# Patient Record
Sex: Female | Born: 2000 | Race: Black or African American | Hispanic: No | Marital: Single | Smoking: Current every day smoker
Health system: Southern US, Community
[De-identification: ages and names within clinical notes are randomized; demographics above are authoritative.]

## PROBLEM LIST (undated history)

## (undated) ENCOUNTER — Inpatient Hospital Stay (HOSPITAL_COMMUNITY): Payer: Self-pay

## (undated) ENCOUNTER — Emergency Department (HOSPITAL_COMMUNITY): Disposition: A | Discharge status: Home / Self Care

## (undated) DIAGNOSIS — Z789 Other specified health status: Secondary | ICD-10-CM

## (undated) DIAGNOSIS — B1920 Unspecified viral hepatitis C without hepatic coma: Secondary | ICD-10-CM

## (undated) DIAGNOSIS — A749 Chlamydial infection, unspecified: Secondary | ICD-10-CM

## (undated) DIAGNOSIS — I10 Essential (primary) hypertension: Secondary | ICD-10-CM

## (undated) HISTORY — DX: Other specified health status: Z78.9

## (undated) HISTORY — PX: NO PAST SURGERIES: SHX2092

---
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
## 2019-06-13 IMAGING — US US MFM OB COMP +14 WKS
1 series · 14 of 28 positions shown · non-contrast
Comparison: none

[Series 1: us mfm ob comp +14 wks · 14 of 28 slices shown]
[im 2/28]
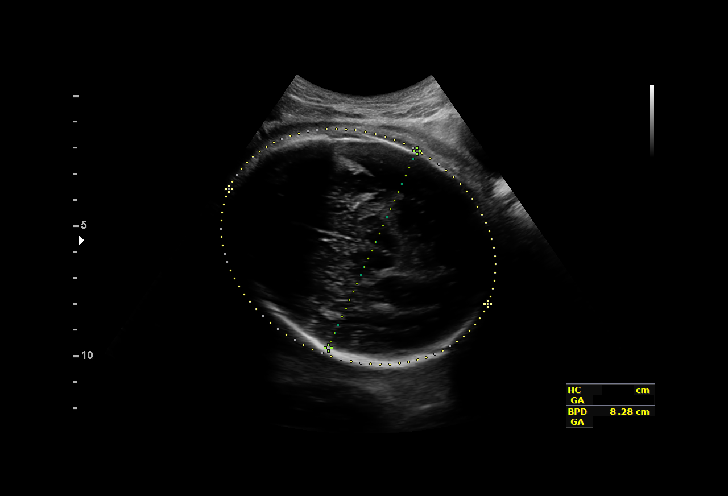
[im 4/28]
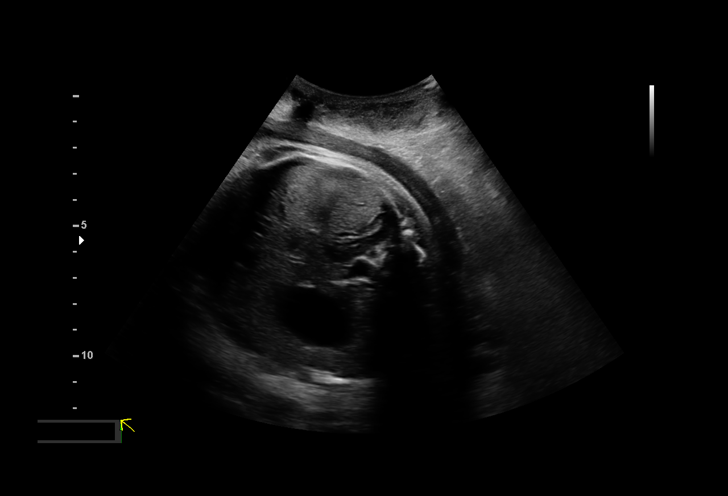
[im 6/28]
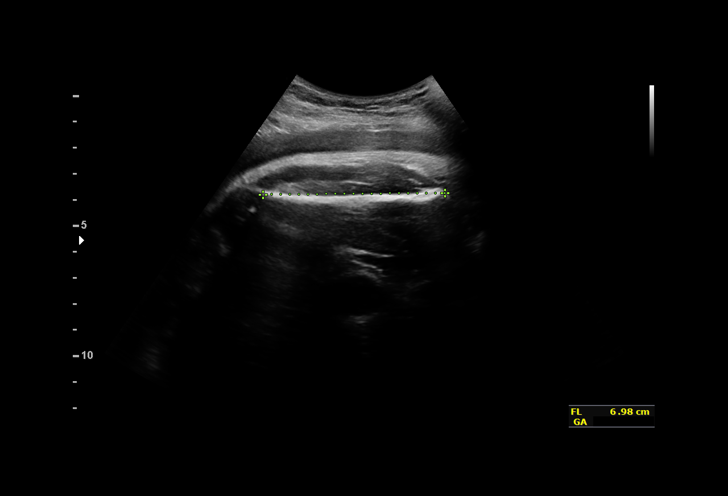
[im 8/28]
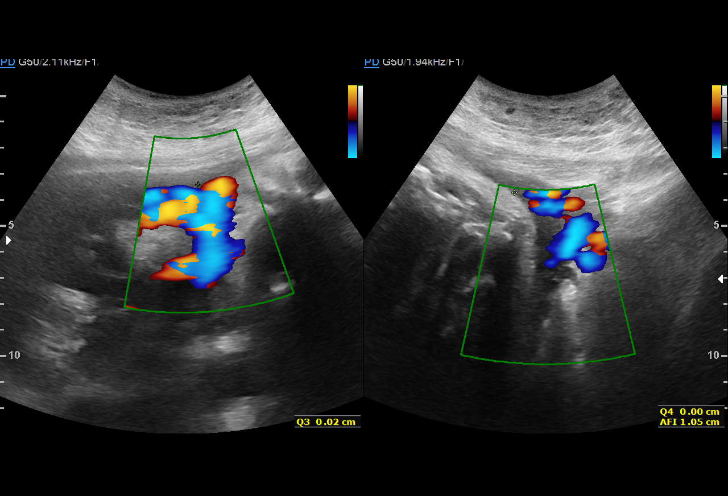
[im 10/28]
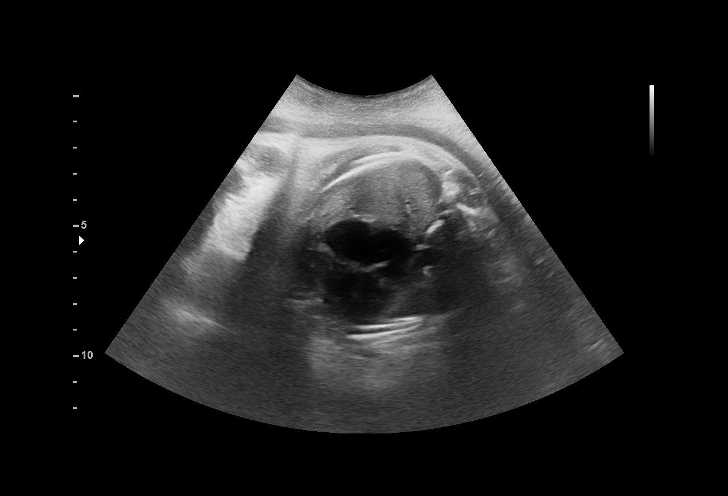
[im 12/28]
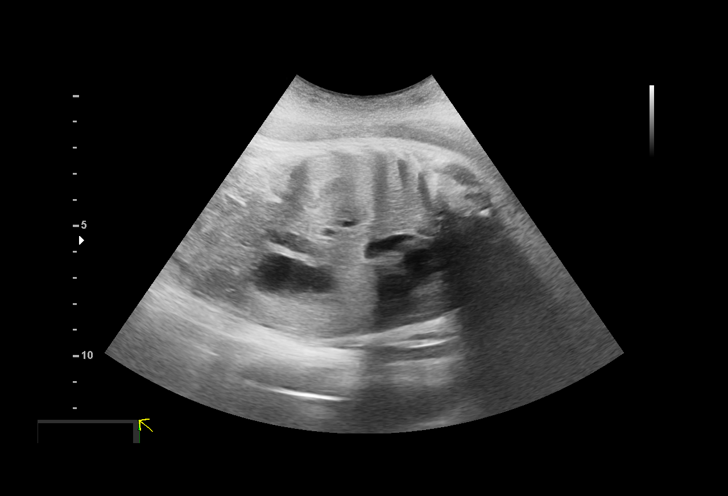
[im 14/28]
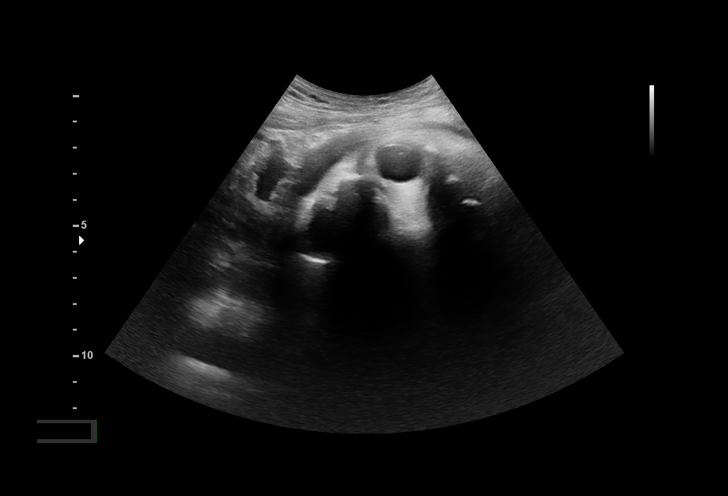
[im 16/28]
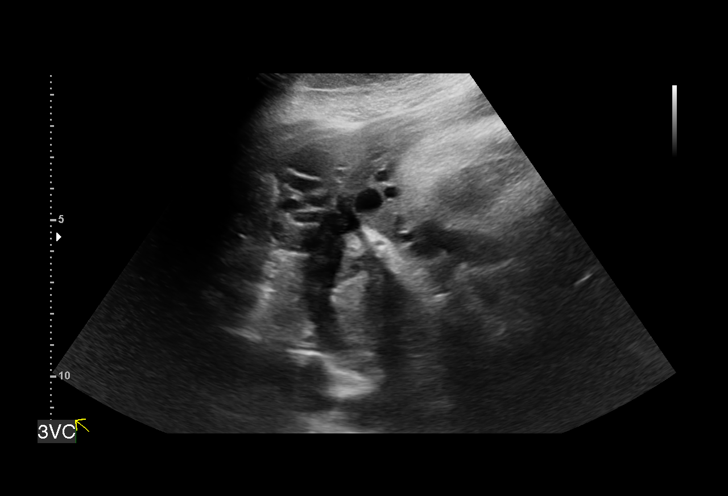
[im 18/28]
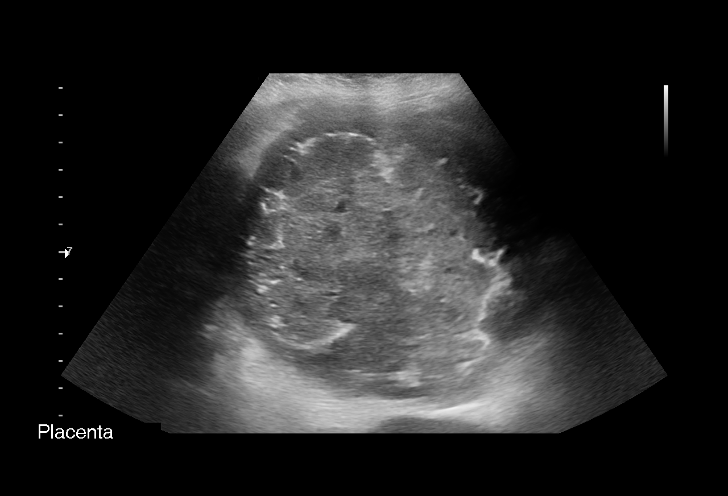
[im 20/28]
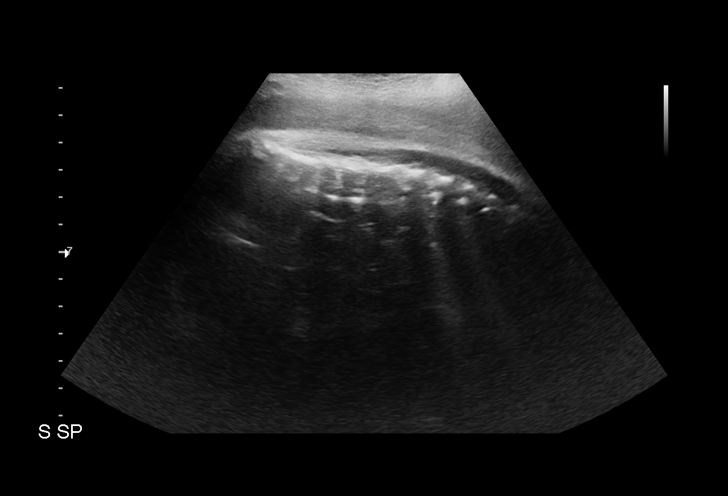
[im 22/28]
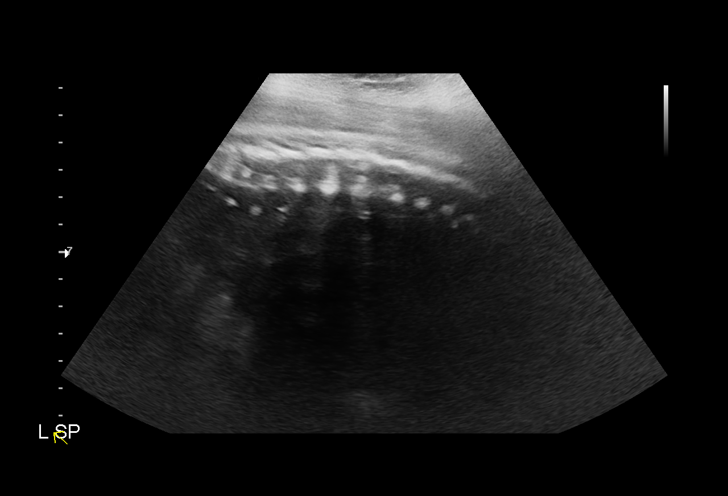
[im 24/28]
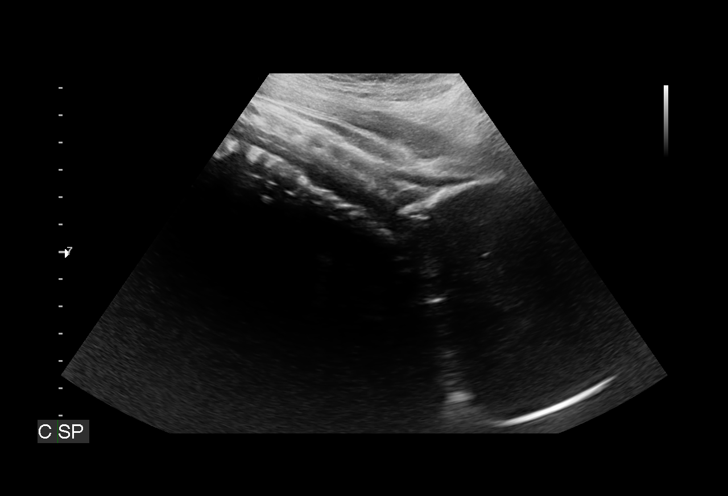
[im 26/28]
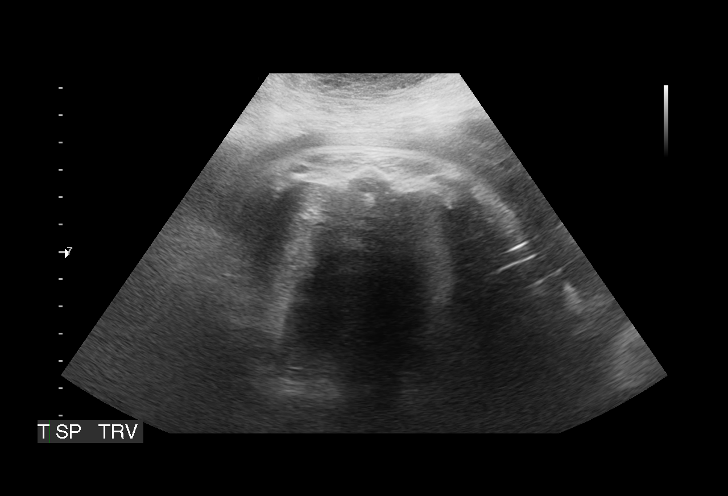
[im 28/28]
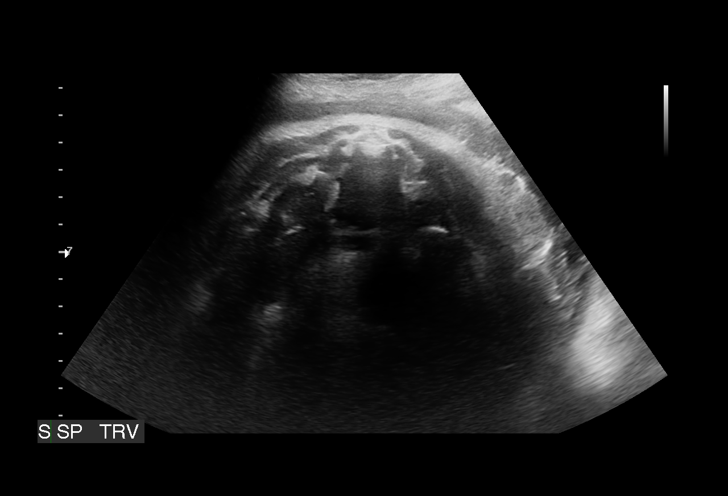

[14 of 28 positions shown; findings below may reference images not displayed]

Name:       AQASALIM LAY-LAY               Visit Date: 11/08/2017 [DATE]

1  BRANIMIR DIZDAR           [id number]      [id number]     [id number]
Indications

34 weeks gestation of pregnancy
Premature rupture of membranes - leaking
fluid
Elevated blood pressure affecting pregnancy
in third trimester
Encounter for uncertain dates
OB History

Gravidity:    1
Fetal Evaluation

Num Of Fetuses:     1
Fetal Heart         122
Rate(bpm):
Cardiac Activity:   Observed
Presentation:       Cephalic
Placenta:           Posterior

Amniotic Fluid
AFI FV:      Oligohydramnios

AFI Sum(cm)     %Tile       Largest Pocket(cm)
1.05            < 3

RUQ(cm)       RLQ(cm)       LUQ(cm)        LLQ(cm)
1.03          0             0
Biometry

BPD:      82.8  mm     G. Age:  33w 2d         21  %    CI:        72.99   %    70 - 86
FL/HC:      22.7   %    19.4 -
HC:      308.1  mm     G. Age:  34w 3d         19  %    HC/AC:      1.04        0.96 -
AC:      295.4  mm     G. Age:  33w 4d         33  %    FL/BPD:     84.3   %    71 - 87
FL:       69.8  mm     G. Age:  35w 6d         79  %    FL/AC:      23.6   %    20 - 24

Est. FW:    6999  gm      5 lb 4 oz     59  %
Gestational Age

U/S Today:     34w 2d                                        EDD:   12/18/17
Best:          34w 2d     Det. By:  U/S (11/08/17)           EDD:   12/18/17
Anatomy

Cranium:               Appears normal         LVOT:                   Not well visualized
Cavum:                 Not well visualized    Aortic Arch:            Not well visualized
Ventricles:            Not well visualized    Ductal Arch:            Not well visualized
Choroid Plexus:        Not well visualized    Diaphragm:              Appears normal
Cerebellum:            Not well visualized    Stomach:                Appears normal, left
sided
Posterior Fossa:       Not well visualized    Abdomen:                Appears normal
Nuchal Fold:           Not well visualized    Abdominal Wall:         Not well visualized
Face:                  Profile appears        Cord Vessels:           Appears normal (3
normal                                         vessel cord)
Lips:                  Not well visualized    Kidneys:                Appear normal
Palate:                Not well visualized    Bladder:                Not well visualized
Thoracic:              Appears normal         Spine:                  Appears normal
Heart:                 Appears normal         Upper Extremities:      Not well visualized
(4CH, axis, and situs
RVOT:                  Not well visualized    Lower Extremities:      Not well visualized
Impression

Single living intrauterine pregnancy at 34w 2d (remote read of
images only)
Cephalic presentation.
Placenta Posterior.
oligohydramnios
no fetal anomalies are demonstrated
limited views as documented above
Appropriate interval fetal growth.
Normal interval fetal anatomy.
Recommendations

Management as clinically indicated by on site OB provider.

## 2019-10-27 ENCOUNTER — Ambulatory Visit: Payer: No Typology Code available for payment source | Admitting: Nurse Practitioner

## 2020-02-08 ENCOUNTER — Other Ambulatory Visit (HOSPITAL_COMMUNITY)
Admission: RE | Admit: 2020-02-08 | Discharge: 2020-02-08 | Disposition: A | Discharge status: Home / Self Care | Payer: Medicaid Other | Source: Ambulatory Visit | Attending: Nurse Practitioner | Admitting: Nurse Practitioner

## 2020-02-08 ENCOUNTER — Other Ambulatory Visit: Payer: Self-pay

## 2020-02-08 ENCOUNTER — Ambulatory Visit (INDEPENDENT_AMBULATORY_CARE_PROVIDER_SITE_OTHER): Payer: Medicaid Other | Admitting: Nurse Practitioner

## 2020-02-08 ENCOUNTER — Encounter: Payer: Self-pay | Admitting: Nurse Practitioner

## 2020-02-08 VITALS — BP 131/88 | HR 85 | Ht 61.0 in | Wt 177.4 lb

## 2020-02-08 DIAGNOSIS — Z01419 Encounter for gynecological examination (general) (routine) without abnormal findings: Secondary | ICD-10-CM

## 2020-02-08 DIAGNOSIS — Z6833 Body mass index (BMI) 33.0-33.9, adult: Secondary | ICD-10-CM

## 2020-02-08 DIAGNOSIS — Z975 Presence of (intrauterine) contraceptive device: Secondary | ICD-10-CM | POA: Insufficient documentation

## 2020-02-08 DIAGNOSIS — Z8759 Personal history of other complications of pregnancy, childbirth and the puerperium: Secondary | ICD-10-CM

## 2020-02-08 NOTE — Progress Notes (Signed)
GYNECOLOGY ANNUAL PREVENTATIVE CARE ENCOUNTER NOTE  Subjective:   Sarah Luna is a 19 y.o. G1P1 female here for a routine annual gynecologic exam.  Current complaints: None.   Denies abnormal vaginal bleeding, discharge, pelvic pain, problems with intercourse or other gynecologic concerns.    Gynecologic History Patient's last menstrual period was 01/14/2020 (exact date). Contraception: Nexplanon   Obstetric History OB History  Gravida Para Term Preterm AB Living  1 1       1   SAB TAB Ectopic Multiple Live Births        0 1    # Outcome Date GA Lbr Len/2nd Weight Sex Delivery Anes PTL Lv  1 Para 11/08/17  09:40 / 00:07 6 lb 5.9 oz (2.89 kg) F Vag-Spont EPI  LIV    History reviewed. No pertinent past medical history.  History reviewed. No pertinent surgical history.  Current Outpatient Medications on File Prior to Visit  Medication Sig Dispense Refill   Etonogestrel (NEXPLANON [place]) Inject into the skin.     acetaminophen (TYLENOL) 325 MG tablet Take 2 tablets (650 mg total) by mouth every 4 (four) hours as needed (for pain scale < 4). (Patient not taking: Reported on 12/08/2017) 30 tablet 5   amLODipine (NORVASC) 5 MG tablet Take 1 tablet (5 mg total) by mouth daily. (Patient not taking: Reported on 02/08/2020) 30 tablet 1   fosinopril-hydrochlorothiazide (MONOPRIL-HCT) 20-12.5 MG tablet Take 1 tablet by mouth daily. (Patient not taking: Reported on 12/08/2017) 30 tablet 1   ibuprofen (ADVIL,MOTRIN) 600 MG tablet Take 1 tablet (600 mg total) by mouth every 6 (six) hours. (Patient not taking: Reported on 12/08/2017) 30 tablet 0   No current facility-administered medications on file prior to visit.    No Known Allergies  Social History   Socioeconomic History   Marital status: Single    Spouse name: Not on file   Number of children: Not on file   Years of education: Not on file   Highest education level: Not on file  Occupational History   Not on file    Tobacco Use   Smoking status: Never Smoker   Smokeless tobacco: Never Used  Substance and Sexual Activity   Alcohol use: Never   Drug use: Never   Sexual activity: Not Currently    Birth control/protection: Pill  Other Topics Concern   Not on file  Social History Narrative   Not on file   Social Determinants of Health   Financial Resource Strain:    Difficulty of Paying Living Expenses:   Food Insecurity:    Worried About 12/10/2017 in the Last Year:    Programme researcher, broadcasting/film/video in the Last Year:   Transportation Needs:    Barista (Medical):    Lack of Transportation (Non-Medical):   Physical Activity:    Days of Exercise per Week:    Minutes of Exercise per Session:   Stress:    Feeling of Stress :   Social Connections:    Frequency of Communication with Friends and Family:    Frequency of Social Gatherings with Friends and Family:    Attends Religious Services:    Active Member of Clubs or Organizations:    Attends Freight forwarder:    Marital Status:   Intimate Partner Violence:    Fear of Current or Ex-Partner:    Emotionally Abused:    Physically Abused:    Sexually Abused:     Family  History  Problem Relation Age of Onset   Diabetes Maternal Grandmother    Hypertension Maternal Grandmother     The following portions of the patient's history were reviewed and updated as appropriate: allergies, current medications, past family history, past medical history, past social history, past surgical history and problem list.  Review of Systems Pertinent items noted in HPI and remainder of comprehensive ROS otherwise negative.   Objective:  BP (!) 141/96    Pulse 95    Ht 5\' 1"  (1.549 m)    Wt 177 lb 6.4 oz (80.5 kg)    LMP 01/14/2020 (Exact Date)    BMI 33.52 kg/m  CONSTITUTIONAL: Well-developed, well-nourished female in no acute distress.  HENT:  Normocephalic, atraumatic, External right and left ear normal.   EYES: Conjunctivae and EOM are normal. Pupils are equal, round.  No scleral icterus.  NECK: Normal range of motion, supple, no masses.  Normal thyroid.  SKIN: Skin is warm and dry. No rash noted. Not diaphoretic. No erythema. No pallor. NEUROLOGIC: Alert and oriented to person, place, and time. Normal reflexes, muscle tone coordination. No cranial nerve deficit noted. PSYCHIATRIC: Normal mood and affect. Normal behavior. Normal judgment and thought content. CARDIOVASCULAR: Normal heart rate noted, regular rhythm RESPIRATORY: Clear to auscultation bilaterally. Effort and breath sounds normal, no problems with respiration noted. BREASTS: Symmetric in size. No masses, skin changes, nipple drainage, or lymphadenopathy. ABDOMEN: Soft, no distention noted.  No tenderness, rebound or guarding.  PELVIC: Normal appearing external genitalia; normal appearing vaginal mucosa and cervix.  No abnormal discharge noted. Normal uterine size, no other palpable masses, no uterine or adnexal tenderness. MUSCULOSKELETAL: Normal range of motion. No tenderness.  No cyanosis, clubbing, or edema.    Assessment and Plan:  1. Women's annual routine gynecological examination Continue Nexplanon - rod intact in arm - until it expires. Plans to have this one removed and have another rod placed. Does not plan to have children at this time. Declines to have blood drawn today. Plans to have Covid vaccine soon.  2. BMI 33.0-33.9,adult Advised weight loss to BMI less than 25  3. History of pre-eclampsia Stopped medication that she was on.  Reports she was told to take for a month and then she would not need it. BP at end of her visit today was normal.  Routine preventative health maintenance measures emphasized. Please refer to After Visit Summary for other counseling recommendations.    12-01-1980, RN, MSN, NP-BC Nurse Practitioner, [facility]

## 2020-02-08 NOTE — Progress Notes (Signed)
Pt. Presents for STI testing Pt. Stated she use to be on BP medication but came off about a year ago.

## 2020-02-08 NOTE — Patient Instructions (Signed)
Drink at least 8 8-oz glasses of water every day. Eat a healthy diet.   Walk briskly to the point of a small amount of perspiration on your forehead for 20 min a day to help you lose weight.

## 2020-02-10 LAB — CERVICOVAGINAL ANCILLARY ONLY
Bacterial Vaginitis (gardnerella): POSITIVE — AB
Candida Glabrata: NEGATIVE
Candida Vaginitis: NEGATIVE
Chlamydia: POSITIVE — AB
Comment: NEGATIVE
Comment: NEGATIVE
Comment: NEGATIVE
Comment: NEGATIVE
Comment: NEGATIVE
Comment: NORMAL
Neisseria Gonorrhea: NEGATIVE
Trichomonas: POSITIVE — AB

## 2020-02-11 ENCOUNTER — Encounter: Payer: Self-pay | Admitting: Nurse Practitioner

## 2020-02-11 DIAGNOSIS — Z8619 Personal history of other infectious and parasitic diseases: Secondary | ICD-10-CM | POA: Insufficient documentation

## 2020-02-11 MED ORDER — METRONIDAZOLE 500 MG PO TABS: 500.0000 mg | ORAL_TABLET | Freq: Two times a day (BID) | ORAL | 0 refills | Status: DC

## 2020-02-11 MED ORDER — AZITHROMYCIN 250 MG PO TABS: 1000.0000 mg | ORAL_TABLET | Freq: Once | ORAL | 0 refills | Status: DC

## 2020-02-11 NOTE — Addendum Note (Signed)
Addended by: Currie Paris on: 02/11/2020 12:52 PM   Modules accepted: Orders

## 2020-02-11 NOTE — Progress Notes (Signed)
Chlamydia and trichomonas noted - Please notify the patient and the health department.  Will send medication to her pharmacy. But no MyChart is available to send a message to her.

## 2020-11-16 ENCOUNTER — Other Ambulatory Visit: Payer: Self-pay

## 2020-11-16 ENCOUNTER — Ambulatory Visit (INDEPENDENT_AMBULATORY_CARE_PROVIDER_SITE_OTHER): Payer: Medicaid Other

## 2020-11-16 VITALS — BP 144/95 | HR 94 | Ht 62.0 in | Wt 193.0 lb

## 2020-11-16 DIAGNOSIS — Z3046 Encounter for surveillance of implantable subdermal contraceptive: Secondary | ICD-10-CM | POA: Diagnosis not present

## 2020-11-16 DIAGNOSIS — Z975 Presence of (intrauterine) contraceptive device: Secondary | ICD-10-CM

## 2020-11-16 MED ORDER — ETONOGESTREL 68 MG ~~LOC~~ IMPL
68.0000 mg | DRUG_IMPLANT | Freq: Once | SUBCUTANEOUS | Status: AC
  Administered 2020-11-16: 68 mg via SUBCUTANEOUS

## 2020-11-16 NOTE — Progress Notes (Addendum)
GYN presents for Nexplanon removal and Insertion.  Administrations This Visit    etonogestrel (NEXPLANON) implant 68 mg    Admin Date 11/16/2020 Action Given Dose 68 mg Route Subdermal Administered By Maretta Bees, RMA

## 2020-11-16 NOTE — Progress Notes (Signed)
GYNECOLOGY OFFICE VISIT NOTE  History:  Sarah Luna is a 20 y.o. [phone] here today for Nexplanon removal and reinsertion. She denies any abnormal vaginal discharge, bleeding, or pelvic pain.  However, she expresses concern with weight gain of ~30lbs since insertion.  Patient states that she does not exercise and eats "everything."  Patient reports she has a cycle Q2-3 months and has no pain or discomfort during sex.  She is overall satisfied with her method.   No past medical history on file.  No past surgical history on file.  The following portions of the patient's history were reviewed and updated as appropriate: allergies, current medications, past family history, past medical history, past social history, past surgical history and problem list.   Health Maintenance:  No pap on file d/t age.  No mammogram on file d/t age.   Review of Systems:  General ROS: negative  Genito-Urinary ROS: negative  Objective:  Vitals: BP (!) 144/95   Pulse 94   Ht 5\' 2"  (1.575 m)   Wt 193 lb (87.5 kg)   BMI 35.30 kg/m   Physical Exam Constitutional:      General: She is not in acute distress.    Appearance: Normal appearance.  HENT:     Head: Normocephalic and atraumatic.  Eyes:     Conjunctiva/sclera: Conjunctivae normal.  Cardiovascular:     Rate and Rhythm: Normal rate.  Pulmonary:     Effort: Pulmonary effort is normal. No respiratory distress.  Musculoskeletal:        General: Normal range of motion.     Cervical back: Normal range of motion.  Skin:    General: Skin is warm and dry.  Neurological:     Mental Status: She is alert and oriented to person, place, and time.  Psychiatric:        Mood and Affect: Mood normal.        Behavior: Behavior normal.      Nexplanon Removal Procedure:  Yulissa A Winner requests removal of expired Nexplanon and reinsertion of new Nexplanon.  Patient also understands that removal can result in blood loss, infection at removal  site, and pain as well as bruising and parenthesis with subsequent insertion.  Patient verbalizes understanding for all risks as stated above and in the consent and desires to proceed with procedure.    Exp: December 19, 2022 Lot: December 21, 2022 NDC: [phone]  Appropriate time out taken.  Patient identified, informed consent performed, consent signed.  Nexplanon site identified; left arm.   The area was then prepped with betadine and allowed 60 seconds before being wiped away with sterile gauze. 1 ml of 1% lidocaine with epi was used to anesthetize the area at the distal end of the implant. A small stab incision was made right beside the implant on the distal portion.  The Nexplanon rod was visualized within the sheath. The sheath was gently removed with the scalpel, resulting in the Nexplanon ejecting from the site without difficulty. The anticipated pathway of insertion was then anesthetized with an additional 23mL of lidocaine. Nexplanon was removed from packaging and device was confirmed within needle by provider visualization.  The device was then inserted per manufacturers instruction without complications.  The device was then palpated in the patient's arm by patient and provider. The insertion area was hemostatic and covered with steri strip and band-aid.  A pressure dressing was then applied to reduce bruising.   *Remove of pressure dressing and band-aid in 12-24 hours. *Remove  of steri-strips after no more than 7 days. *Condom usage or abstinence for the next 7 days. *Tylenol or ibuprofen for insertion pain/discomfort. *Call for any other questions, concerns, or complications.   Assessment & Plan:  20 year old Nexplanon Removal and Reinsertion Weight Gain  -Reviewed concern with weight gain. -Discussed AHA recommendation of 30 minutes of moderate to vigorous exercise 5x/week.  -Encouraged to start gradually and increase as tolerated. -Reviewed healthy/balanced eating habits and  choices. -Discussed returning in 6 months for annual exam and referral to nutrition and weight loss services if no improvement with above interventions. -Nexplanon inserted as above. -Encouraged condom usage to reduce risk of STD exposure.  Total face-to-face time with patient: 15 minutes   Cherre Robins MSN, CNM 11/16/2020

## 2021-04-22 ENCOUNTER — Ambulatory Visit: Payer: Medicaid Other | Admitting: Internal Medicine

## 2021-06-04 ENCOUNTER — Ambulatory Visit: Payer: Self-pay | Attending: Internal Medicine | Admitting: Internal Medicine

## 2021-06-04 ENCOUNTER — Encounter: Payer: Self-pay | Admitting: Internal Medicine

## 2021-06-04 ENCOUNTER — Other Ambulatory Visit: Payer: Self-pay

## 2021-06-04 VITALS — BP 129/79 | HR 86 | Ht 62.0 in | Wt 183.0 lb

## 2021-06-04 DIAGNOSIS — I10 Essential (primary) hypertension: Secondary | ICD-10-CM | POA: Insufficient documentation

## 2021-06-04 DIAGNOSIS — Z114 Encounter for screening for human immunodeficiency virus [HIV]: Secondary | ICD-10-CM

## 2021-06-04 DIAGNOSIS — Z1159 Encounter for screening for other viral diseases: Secondary | ICD-10-CM

## 2021-06-04 DIAGNOSIS — E66811 Obesity, class 1: Secondary | ICD-10-CM

## 2021-06-04 DIAGNOSIS — Z7189 Other specified counseling: Secondary | ICD-10-CM | POA: Insufficient documentation

## 2021-06-04 DIAGNOSIS — Z2821 Immunization not carried out because of patient refusal: Secondary | ICD-10-CM

## 2021-06-04 DIAGNOSIS — Z7689 Persons encountering health services in other specified circumstances: Secondary | ICD-10-CM

## 2021-06-04 DIAGNOSIS — Z79899 Other long term (current) drug therapy: Secondary | ICD-10-CM | POA: Insufficient documentation

## 2021-06-04 DIAGNOSIS — Z713 Dietary counseling and surveillance: Secondary | ICD-10-CM | POA: Insufficient documentation

## 2021-06-04 DIAGNOSIS — Z8619 Personal history of other infectious and parasitic diseases: Secondary | ICD-10-CM | POA: Insufficient documentation

## 2021-06-04 DIAGNOSIS — Z6833 Body mass index (BMI) 33.0-33.9, adult: Secondary | ICD-10-CM | POA: Insufficient documentation

## 2021-06-04 DIAGNOSIS — F17290 Nicotine dependence, other tobacco product, uncomplicated: Secondary | ICD-10-CM

## 2021-06-04 DIAGNOSIS — E669 Obesity, unspecified: Secondary | ICD-10-CM | POA: Insufficient documentation

## 2021-06-04 DIAGNOSIS — R030 Elevated blood-pressure reading, without diagnosis of hypertension: Secondary | ICD-10-CM

## 2021-06-04 NOTE — Progress Notes (Signed)
Patient ID: Sarah Luna, female    DOB: 02/28/2001  MRN: [id number]  CC: New Patient (Initial Visit) and Hypertension   Subjective: Sarah Luna is a 20 y.o. female who presents for new pt visit Her concerns today include:  Pt with hx of HTN, obesity, pre-eclampsia,   Pt here to est care.  Use to be seen at [facility]. Pt with hx of pre-eclampsia in 2019.  BP remained elev post delivery so she was kept on antihypertensive for a while.Marland Kitchen She was able to get off med for a few mths but had to be put back on it. She was on Norvasc and Lis/HCTZ.  Ran out 01/2020.  Checks BP every few days.  Last reading 120/84 3 days ago. Occasional higher. No CP/SOB/LE edema/dizziness.  Occasional HA -admits that her salt intake is more than it should be  Obesity: reports wgh gain of about 30 lbs since placement of Nexplanon 2019.  Placed after giving birth.  She has cut back on eating late at nights and on portion sizes. Eating more fruits. Drinks water and juices  Hx of hep C on chart but pt not aware of ever having been diagnosed with this.  I also see no lab results in the chart of hepatitis C antibody or RNA quant.   She smokes cigars x1 year.  She is wanting to quit.  She has been weaning herself down.  Previously she was smoking 4 cigars a day but now down to 1 cigar a day.    Past medical, surgical, family history and social history reviewed and updated.  HM: Patient declines flu and pneumonia vaccines.  She is agreeable to screening for hepatitis C and HIV. Patient Active Problem List   Diagnosis Date Noted   History of hepatitis C 02/11/2020   Nexplanon in place 02/08/2020   Pre-eclampsia in third trimester 11/08/2017   Obesity (BMI 30.0-34.9) 02/23/2014     Current Outpatient Medications on File Prior to Visit  Medication Sig Dispense Refill   acetaminophen (TYLENOL) 325 MG tablet Take 2 tablets (650 mg total) by mouth every 4 (four) hours as needed (for pain scale < 4).  (Patient not taking: Reported on 12/08/2017) 30 tablet 5   amLODipine (NORVASC) 5 MG tablet Take 1 tablet (5 mg total) by mouth daily. (Patient not taking: Reported on 02/08/2020) 30 tablet 1   Etonogestrel (NEXPLANON [place]) Inject into the skin. (Patient not taking: Reported on 06/04/2021)     fosinopril-hydrochlorothiazide (MONOPRIL-HCT) 20-12.5 MG tablet Take 1 tablet by mouth daily. (Patient not taking: Reported on 12/08/2017) 30 tablet 1   ibuprofen (ADVIL,MOTRIN) 600 MG tablet Take 1 tablet (600 mg total) by mouth every 6 (six) hours. (Patient not taking: Reported on 12/08/2017) 30 tablet 0   metroNIDAZOLE (FLAGYL) 500 MG tablet Take 1 tablet (500 mg total) by mouth 2 (two) times daily. No alcohol while taking this medication.  Begin the next day after taking Azithromycin. (Patient not taking: Reported on 11/16/2020) 14 tablet 0   No current facility-administered medications on file prior to visit.    No Known Allergies  Social History   Socioeconomic History   Marital status: Single    Spouse name: Not on file   Number of children: 1   Years of education: Not on file   Highest education level: 12th grade  Occupational History   Occupation: [employer]  Tobacco Use   Smoking status: Every Day    Types: Cigars  Smokeless tobacco: Never  Vaping Use   Vaping Use: Never used  Substance and Sexual Activity   Alcohol use: Never   Drug use: Never   Sexual activity: Not Currently    Birth control/protection: Implant  Other Topics Concern   Not on file  Social History Narrative   Not on file   Social Determinants of Health   Financial Resource Strain: Not on file  Food Insecurity: Not on file  Transportation Needs: Not on file  Physical Activity: Not on file  Stress: Not on file  Social Connections: Not on file  Intimate Partner Violence: Not on file    Family History  Problem Relation Age of Onset   Diabetes Maternal Grandmother    Hypertension Maternal  Grandmother     Past Surgical History:  Procedure Laterality Date   NO PAST SURGERIES      ROS: Review of Systems Negative except as stated above  PHYSICAL EXAM: BP 129/79    Pulse 86    Ht 5\' 2"  (1.575 m)    Wt 183 lb (83 kg)    SpO2 100%    BMI 33.47 kg/m   Wt Readings from Last 3 Encounters:  06/04/21 183 lb (83 kg)  11/16/20 193 lb (87.5 kg)  02/08/20 177 lb 6.4 oz (80.5 kg)    Physical Exam  General appearance - alert, well appearing, young obese African-American female and in no distress Mental status - normal mood, behavior, speech, dress, motor activity, and thought processes Nose - normal and patent, no erythema, discharge or polyps Mouth - mucous membranes moist, pharynx normal without lesions Neck - supple, no significant adenopathy Chest - clear to auscultation, no wheezes, rales or rhonchi, symmetric air entry Heart - normal rate, regular rhythm, normal S1, S2, no murmurs, rubs, clicks or gallops Extremities - peripheral pulses normal, no pedal edema, no clubbing or cyanosis   CMP Latest Ref Rng & Units 11/08/2017  Glucose 65 - 99 mg/dL 90  BUN 6 - 20 mg/dL 5(L)  Creatinine 11/10/2017 - 1.00 mg/dL 6.33  Sodium 3.54 - 562 mmol/L 138  Potassium 3.5 - 5.1 mmol/L 3.7  Chloride 101 - 111 mmol/L 109  CO2 22 - 32 mmol/L 16(L)  Calcium 8.9 - 10.3 mg/dL 8.9  Total Protein 6.5 - 8.1 g/dL 6.7  Total Bilirubin 0.3 - 1.2 mg/dL 0.8  Alkaline Phos 47 - 119 U/L 230(H)  AST 15 - 41 U/L 28  ALT 14 - 54 U/L 15   Lipid Panel  No results found for: CHOL, TRIG, HDL, CHOLHDL, VLDL, LDLCALC, LDLDIRECT  CBC    Component Value Date/Time   WBC 19.2 (H) 11/08/2017 2114   RBC 3.82 11/08/2017 2114   HGB 8.8 (L) 11/08/2017 2114   HCT 28.4 (L) 11/08/2017 2114   PLT 180 11/08/2017 2114   MCV 74.3 (L) 11/08/2017 2114   MCH 23.0 (L) 11/08/2017 2114   MCHC 31.0 11/08/2017 2114   RDW 16.4 (H) 11/08/2017 2114   LYMPHSABS 0.8 (L) 11/08/2017 1134   MONOABS 0.5 11/08/2017 1134   EOSABS  0.0 11/08/2017 1134   BASOSABS 0.0 11/08/2017 1134   Depression screen PHQ 2/9 06/04/2021  Decreased Interest 0  Down, Depressed, Hopeless 0  PHQ - 2 Score 0  Altered sleeping 0  Tired, decreased energy 0  Change in appetite 0  Feeling bad or failure about yourself  0  Trouble concentrating 0  Suicidal thoughts 0  PHQ-9 Score 0    ASSESSMENT AND PLAN: 1.  Establishing care with new doctor, encounter for   2. Elevated blood pressure reading Blood pressure today x2 elevated but not stage I HTN.  I think we can hold off on restarting medication.  DASH diet discussed and encouraged.  Advised to check blood pressure at least once a week and record the readings.  Bring them with her on next visit when she comes to get her Pap smear.  3. Obesity (BMI 30.0-34.9) Discussed and encourage healthy eating habits.  Advised to eliminate sugary drinks from the diet and drink water instead, cut back on portion sizes of white carbohydrates, incorporate fresh fruits and vegetables into the diet daily and eat more lean white meat instead of red meat or pork.  Recommend getting in at least 150 minutes/week total of moderate intensity exercise.  Printed information given on healthy eating habits. - CBC - Hemoglobin A1c  4. Cigar smoker Pt is current smoker. Patient advised to quit smoking. Discussed health risks associated with smoking including lung and other types of cancers, chronic lung diseases and CV risks.. Pt ready/not ready to give trail of quitting.   Discussed methods to help quit including quitting cold [place], use of NRT, Chantix and Bupropion.  Pt wanting to try: Patient does not want medication.  States that she will wean herself off as she has been doing.  I have encouraged her to set a quit date. _3_ Minutes spent on counseling. F/U: Reassess progress on next visit in 6 weeks   5. Influenza vaccination declined Recommended.  Patient declined.  6. Pneumococcal vaccination  declined   7. Need for hepatitis C screening test -We will screen for hepatitis C and update her records if her screen is negative. - HCV Ab w Reflex to Quant PCR  8. Screening for HIV (human immunodeficiency virus) - HIV antibody (with reflex)   Patient was given the opportunity to ask questions.  Patient verbalized understanding of the plan and was able to repeat key elements of the plan.   Orders Placed This Encounter  Procedures   CBC   Hemoglobin A1c   HIV antibody (with reflex)   HCV Ab w Reflex to Quant PCR     Requested Prescriptions    No prescriptions requested or ordered in this encounter    Return in about 6 weeks (around 07/16/2021) for PAP.  Jonah Blue, MD, FACP

## 2021-06-04 NOTE — Patient Instructions (Signed)
Healthy Eating °Following a healthy eating pattern may help you to achieve and maintain a healthy body weight, reduce the risk of chronic disease, and live a long and productive life. It is important to follow a healthy eating pattern at an appropriate calorie level for your body. Your nutritional needs should be met primarily through food by choosing a variety of nutrient-rich foods. °What are tips for following this plan? °Reading food labels °Read labels and choose the following: °Reduced or low sodium. °Juices with 100% fruit juice. °Foods with low saturated fats and high polyunsaturated and monounsaturated fats. °Foods with whole grains, such as whole wheat, cracked wheat, brown rice, and wild rice. °Whole grains that are fortified with folic acid. This is recommended for women who are pregnant or who want to become pregnant. °Read labels and avoid the following: °Foods with a lot of added sugars. These include foods that contain brown sugar, corn sweetener, corn syrup, dextrose, fructose, glucose, high-fructose corn syrup, honey, invert sugar, lactose, malt syrup, maltose, molasses, raw sugar, sucrose, trehalose, or turbinado sugar. °Do not eat more than the following amounts of added sugar per day: °6 teaspoons (25 g) for women. °9 teaspoons (38 g) for men. °Foods that contain processed or refined starches and grains. °Refined grain products, such as white flour, degermed cornmeal, white bread, and white rice. °Shopping °Choose nutrient-rich snacks, such as vegetables, whole fruits, and nuts. Avoid high-calorie and high-sugar snacks, such as potato chips, fruit snacks, and candy. °Use oil-based dressings and spreads on foods instead of solid fats such as butter, stick margarine, or cream cheese. °Limit pre-made sauces, mixes, and "instant" products such as flavored rice, instant noodles, and ready-made pasta. °Try more plant-protein sources, such as tofu, tempeh, black beans, edamame, lentils, nuts, and  seeds. °Explore eating plans such as the Mediterranean diet or vegetarian diet. °Cooking °Use oil to sauté or stir-fry foods instead of solid fats such as butter, stick margarine, or lard. °Try baking, boiling, grilling, or broiling instead of frying. °Remove the fatty part of meats before cooking. °Steam vegetables in water or broth. °Meal planning ° °At meals, imagine dividing your plate into fourths: °One-half of your plate is fruits and vegetables. °One-fourth of your plate is whole grains. °One-fourth of your plate is protein, especially lean meats, poultry, eggs, tofu, beans, or nuts. °Include low-fat dairy as part of your daily diet. °Lifestyle °Choose healthy options in all settings, including home, work, school, restaurants, or stores. °Prepare your food safely: °Wash your hands after handling raw meats. °Keep food preparation surfaces clean by regularly washing with hot, soapy water. °Keep raw meats separate from ready-to-eat foods, such as fruits and vegetables. °Cook seafood, meat, poultry, and eggs to the recommended internal temperature. °Store foods at safe temperatures. In general: °Keep cold foods at 40°F (4.4°C) or below. °Keep hot foods at 140°F (60°C) or above. °Keep your freezer at 0°F (-17.8°C) or below. °Foods are no longer safe to eat when they have been between the temperatures of 40°-140°F (4.4-60°C) for more than 2 hours. °What foods should I eat? °Fruits °Aim to eat 2 cup-equivalents of fresh, canned (in natural juice), or frozen fruits each day. Examples of 1 cup-equivalent of fruit include 1 small apple, 8 large strawberries, 1 cup canned fruit, ½ cup dried fruit, or 1 cup 100% juice. °Vegetables °Aim to eat 2½-3 cup-equivalents of fresh and frozen vegetables each day, including different varieties and colors. Examples of 1 cup-equivalent of vegetables include 2 medium carrots, 2 cups raw,   leafy greens, 1 cup chopped vegetable (raw or cooked), or 1 medium baked potato. °Grains °Aim to  eat 6 ounce-equivalents of whole grains each day. Examples of 1 ounce-equivalent of grains include 1 slice of bread, 1 cup ready-to-eat cereal, 3 cups popcorn, or ½ cup cooked rice, pasta, or cereal. °Meats and other proteins °Aim to eat 5-6 ounce-equivalents of protein each day. Examples of 1 ounce-equivalent of protein include 1 egg, 1/2 cup nuts or seeds, or 1 tablespoon (16 g) peanut butter. A cut of meat or fish that is the size of a deck of cards is about 3-4 ounce-equivalents. °Of the protein you eat each week, try to have at least 8 ounces come from seafood. This includes salmon, trout, herring, and anchovies. °Dairy °Aim to eat 3 cup-equivalents of fat-free or low-fat dairy each day. Examples of 1 cup-equivalent of dairy include 1 cup (240 mL) milk, 8 ounces (250 g) yogurt, 1½ ounces (44 g) natural cheese, or 1 cup (240 mL) fortified soy milk. °Fats and oils °Aim for about 5 teaspoons (21 g) per day. Choose monounsaturated fats, such as canola and olive oils, avocados, peanut butter, and most nuts, or polyunsaturated fats, such as sunflower, corn, and soybean oils, walnuts, pine nuts, sesame seeds, sunflower seeds, and flaxseed. °Beverages °Aim for six 8-oz glasses of water per day. Limit coffee to three to five 8-oz cups per day. °Limit caffeinated beverages that have added calories, such as soda and energy drinks. °Limit alcohol intake to no more than 1 drink a day for nonpregnant women and 2 drinks a day for men. One drink equals 12 oz of beer (355 mL), 5 oz of wine (148 mL), or 1½ oz of hard liquor (44 mL). °Seasoning and other foods °Avoid adding excess amounts of salt to your foods. Try flavoring foods with herbs and spices instead of salt. °Avoid adding sugar to foods. °Try using oil-based dressings, sauces, and spreads instead of solid fats. °This information is based on general [place] nutrition guidelines. For more information, visit choosemyplate.gov. Exact amounts may vary based on your nutrition  needs. °Summary °A healthy eating plan may help you to maintain a healthy weight, reduce the risk of chronic diseases, and stay active throughout your life. °Plan your meals. Make sure you eat the right portions of a variety of nutrient-rich foods. °Try baking, boiling, grilling, or broiling instead of frying. °Choose healthy options in all settings, including home, work, school, restaurants, or stores. °This information is not intended to replace advice given to you by your health care provider. Make sure you discuss any questions you have with your health care provider. °Document Revised: 02/05/2021 Document Reviewed: 02/05/2021 °Elsevier Patient Education © 2022 [employer] ° °

## 2021-06-04 NOTE — Progress Notes (Signed)
Patient needs a provider because of hypertension.

## 2021-06-05 LAB — CBC
Hematocrit: 38.4 % (ref 34.0–46.6)
Hemoglobin: 12.8 g/dL (ref 11.1–15.9)
MCH: 28.6 pg (ref 26.6–33.0)
MCHC: 33.3 g/dL (ref 31.5–35.7)
MCV: 86 fL (ref 79–97)
Platelets: 158 10*3/uL (ref 150–450)
RBC: 4.48 x10E6/uL (ref 3.77–5.28)
RDW: 13.5 % (ref 11.7–15.4)
WBC: 3.5 10*3/uL (ref 3.4–10.8)

## 2021-06-05 LAB — HCV INTERPRETATION

## 2021-06-05 LAB — HIV ANTIBODY (ROUTINE TESTING W REFLEX): HIV Screen 4th Generation wRfx: NONREACTIVE

## 2021-06-05 LAB — HEMOGLOBIN A1C
Est. average glucose Bld gHb Est-mCnc: 105 mg/dL
Hgb A1c MFr Bld: 5.3 % (ref 4.8–5.6)

## 2021-06-05 LAB — HCV AB W REFLEX TO QUANT PCR: HCV Ab: <0.1 s/co ratio (ref 0.0–0.9)

## 2021-07-23 ENCOUNTER — Other Ambulatory Visit: Payer: Self-pay

## 2021-07-23 ENCOUNTER — Ambulatory Visit (INDEPENDENT_AMBULATORY_CARE_PROVIDER_SITE_OTHER): Payer: Self-pay | Admitting: Advanced Practice Midwife

## 2021-07-23 VITALS — BP 131/94 | HR 75 | Ht 62.0 in | Wt 184.4 lb

## 2021-07-23 DIAGNOSIS — Z3046 Encounter for surveillance of implantable subdermal contraceptive: Secondary | ICD-10-CM

## 2021-07-23 NOTE — Progress Notes (Signed)
Desires Nexplanon removal. Reports weight gain and fluctuation.

## 2021-07-23 NOTE — Progress Notes (Signed)
GYNECOLOGY CLINIC PROCEDURE NOTE  RASHELL WOLD is a 21 y.o. G1P1 here for Nexplanon removal.  No Pap hx. No other gynecologic concerns.  Nexplanon Removal Patient identified, informed consent performed, consent signed.   Appropriate time out taken. Nexplanon site identified.  Area prepped in usual sterile fashon. One ml of 1% lidocaine was used to anesthetize the area at the distal end of the implant. A small stab incision was made right beside the implant on the distal portion.  The Nexplanon rod was grasped using hemostats and removed without difficulty.  There was minimal blood loss. There were no complications.  3 ml of 1% lidocaine was injected around the incision for post-procedure analgesia.  Steri-strips were applied over the small incision.  A pressure bandage was applied to reduce any bruising.  The patient tolerated the procedure well and was given post procedure instructions.  Patient is planning to use condoms for contraception.  Pt to schedule annual exam/Pap. Contraceptive counseling done so pt to follow up with any other contraceptive needs.   Fatima Blank, CNM 8:42 AM

## 2021-08-06 ENCOUNTER — Ambulatory Visit: Payer: Medicaid Other | Admitting: Internal Medicine

## 2022-04-18 ENCOUNTER — Ambulatory Visit: Payer: Medicaid Other | Admitting: Internal Medicine

## 2022-08-03 ENCOUNTER — Emergency Department (HOSPITAL_COMMUNITY)
Admission: EM | Admit: 2022-08-03 | Discharge: 2022-08-03 | Disposition: A | Discharge status: Home / Self Care | Payer: Medicaid Other | Attending: Emergency Medicine | Admitting: Emergency Medicine

## 2022-08-03 ENCOUNTER — Encounter (HOSPITAL_COMMUNITY): Payer: Self-pay

## 2022-08-03 ENCOUNTER — Emergency Department (HOSPITAL_COMMUNITY): Payer: Medicaid Other

## 2022-08-03 DIAGNOSIS — X58XXXA Exposure to other specified factors, initial encounter: Secondary | ICD-10-CM | POA: Insufficient documentation

## 2022-08-03 DIAGNOSIS — Z79899 Other long term (current) drug therapy: Secondary | ICD-10-CM | POA: Insufficient documentation

## 2022-08-03 DIAGNOSIS — T189XXA Foreign body of alimentary tract, part unspecified, initial encounter: Secondary | ICD-10-CM | POA: Insufficient documentation

## 2022-08-03 NOTE — ED Notes (Signed)
Patient verbalizes understanding of d/c instructions. Opportunities for questions and answers were provided. Pt d/c from ED and ambulated to lobby.

## 2022-08-03 NOTE — ED Triage Notes (Signed)
Pt to ED c/o swallowing tongue ring.,  Reports "I can feel it in my throat" Pt airway patent in triage. No s/s of acute distress noted.

## 2022-08-03 NOTE — ED Provider Notes (Signed)
[facility] Provider Note   CSN: [id number] Arrival date & time: 08/03/22  2145     History  Chief Complaint  Patient presents with   Swallowed Foreign Body    AIYLAH BAEZA is a 22 y.o. female with noncontributory past medical history who presents with concern for something stuck in throat after swallowing one of the balls at the end of her tongue ring.  Patient reports that it came loose while she was eating, and she was not able to find the other end of the bar, thinks that she may have swallowed it.  She reports that she has a possible foreign body sensation but is not having any difficulty swallowing, not having any difficulty breathing.   Swallowed Foreign Body       Home Medications Prior to Admission medications   Medication Sig Start Date End Date Taking? Authorizing Provider  acetaminophen (TYLENOL) 325 MG tablet Take 2 tablets (650 mg total) by mouth every 4 (four) hours as needed (for pain scale < 4). Patient not taking: Reported on 12/08/2017 11/10/17   Constant, Peggy, MD  amLODipine (NORVASC) 5 MG tablet Take 1 tablet (5 mg total) by mouth daily. 12/08/17   Sloan Leiter, MD  Etonogestrel [facility]) Inject into the skin. Patient not taking: Reported on 06/04/2021 11/10/17   [provider]  fosinopril-hydrochlorothiazide (MONOPRIL-HCT) 20-12.5 MG tablet Take 1 tablet by mouth daily. 11/20/17   Starr Lake, CNM  ibuprofen (ADVIL,MOTRIN) 600 MG tablet Take 1 tablet (600 mg total) by mouth every 6 (six) hours. 11/10/17   Constant, Peggy, MD  metroNIDAZOLE (FLAGYL) 500 MG tablet Take 1 tablet (500 mg total) by mouth 2 (two) times daily. No alcohol while taking this medication.  Begin the next day after taking Azithromycin. 02/11/20   Virginia Rochester, NP      Allergies    Patient has no known allergies.    Review of Systems   Review of Systems  All other systems reviewed and are  negative.   Physical Exam Updated Vital Signs BP 132/88   Pulse 70   Temp 98.1 F (36.7 C) (Oral)   Resp 16   Ht 5' 1"$  (1.549 m)   Wt 68 kg   LMP 07/31/2022   SpO2 100%   BMI 28.34 kg/m  Physical Exam Vitals and nursing note reviewed.  Constitutional:      General: She is not in acute distress.    Appearance: Normal appearance.  HENT:     Head: Normocephalic and atraumatic.  Eyes:     General:        Right eye: No discharge.        Left eye: No discharge.  Cardiovascular:     Rate and Rhythm: Normal rate and regular rhythm.  Pulmonary:     Effort: Pulmonary effort is normal. No respiratory distress.     Comments: No stridor, or other respiratory distress noted Musculoskeletal:        General: No deformity.  Skin:    General: Skin is warm and dry.  Neurological:     Mental Status: She is alert and oriented to person, place, and time.  Psychiatric:        Mood and Affect: Mood normal.        Behavior: Behavior normal.     ED Results / Procedures / Treatments   Labs (all labs ordered are listed, but only abnormal results are displayed) Labs Reviewed -  No data to display  EKG None  Radiology DG Neck Soft Tissue  Result Date: 08/03/2022 CLINICAL DATA:  Foreign body. EXAM: NECK SOFT TISSUES - 1+ VIEW COMPARISON:  None Available. FINDINGS: There is no evidence of retropharyngeal soft tissue swelling or epiglottic enlargement. The cervical airway is unremarkable and no radio-opaque foreign body identified. IMPRESSION: Negative. Electronically Signed   By: Anner Crete M.D.   On: 08/03/2022 23:04   DG Chest 2 View  Result Date: 08/03/2022 CLINICAL DATA:  Foreign body. EXAM: CHEST - 2 VIEW COMPARISON:  None Available. FINDINGS: No radiopaque foreign object identified. The heart size and mediastinal contours are within normal limits. Both lungs are clear. The visualized skeletal structures are unremarkable. IMPRESSION: Negative. Electronically Signed   By: Anner Crete M.D.   On: 08/03/2022 23:03    Procedures Procedures    Medications Ordered in ED Medications - No data to display  ED Course/ Medical Decision Making/ A&P                             Medical Decision Making Amount and/or Complexity of Data Reviewed Radiology: ordered.   This patient is a 22 y.o. female who presents to the ED for concern of foreign body in throat.   Differential diagnoses prior to evaluation: Retained foreign body versus swallowed foreign body, esophageal, or other GI perforation secondary to foreign body versus no swallowed foreign body with perceived sensation  Past Medical History / Social History / Additional history: Chart reviewed. Pertinent results include: noncontributory  Physical Exam: Physical exam performed. The pertinent findings include: Stable vital signs, mild hypertension on arrival, but Normal blood pressure at time of discharge, not febrile, normal pulse rate, normal respirations, normal oxygen saturation she is not having stridor or other respiratory distress  I independently interpreted plain film soft tissue neck, plain film chest xray showing no evidence of foreign body. I agree with radiologist interpretation.  Disposition: After consideration of the diagnostic results and the patients response to treatment, I feel that patient may have swallowed the end of her tongue piercing but it is a small, ball bearing shaped object. Discussed it should pass without difficulty most likely .   emergency department workup does not suggest an emergent condition requiring admission or immediate intervention beyond what has been performed at this time. The plan is: as above. The patient is safe for discharge and has been instructed to return immediately for worsening symptoms, change in symptoms or any other concerns.  Final Clinical Impression(s) / ED Diagnoses Final diagnoses:  Swallowed foreign body, initial encounter    Rx / DC  Orders ED Discharge Orders     None         Dorien Chihuahua 08/03/22 2322    Dorie Rank, MD 08/04/22 [phone]

## 2022-09-30 ENCOUNTER — Telehealth: Payer: Self-pay

## 2022-09-30 NOTE — Telephone Encounter (Signed)
Copied from CRM [id number]. Topic: General - Inquiry >> Sep 30, 2022  9:47 AM Clide Dales wrote: Patient is requesting her immunization records for work.

## 2022-10-15 NOTE — Telephone Encounter (Signed)
Called pt LVM to call back

## 2022-12-23 DIAGNOSIS — Z708 Other sex counseling: Secondary | ICD-10-CM | POA: Diagnosis not present

## 2022-12-23 DIAGNOSIS — Z202 Contact with and (suspected) exposure to infections with a predominantly sexual mode of transmission: Secondary | ICD-10-CM | POA: Diagnosis not present

## 2022-12-23 DIAGNOSIS — Z7251 High risk heterosexual behavior: Secondary | ICD-10-CM | POA: Diagnosis not present

## 2023-01-09 ENCOUNTER — Ambulatory Visit: Payer: 59 | Admitting: Internal Medicine

## 2023-04-15 ENCOUNTER — Ambulatory Visit
Admission: RE | Admit: 2023-04-15 | Discharge: 2023-04-15 | Disposition: A | Discharge status: Home / Self Care | Payer: 59 | Source: Ambulatory Visit | Attending: Internal Medicine | Admitting: Internal Medicine

## 2023-04-15 ENCOUNTER — Ambulatory Visit: Payer: 59

## 2023-04-15 VITALS — BP 129/87 | HR 58 | Temp 98.6°F | Resp 16

## 2023-04-15 DIAGNOSIS — Z202 Contact with and (suspected) exposure to infections with a predominantly sexual mode of transmission: Secondary | ICD-10-CM | POA: Insufficient documentation

## 2023-04-15 DIAGNOSIS — Z113 Encounter for screening for infections with a predominantly sexual mode of transmission: Secondary | ICD-10-CM | POA: Insufficient documentation

## 2023-04-15 DIAGNOSIS — N898 Other specified noninflammatory disorders of vagina: Secondary | ICD-10-CM | POA: Diagnosis not present

## 2023-04-15 LAB — POCT URINALYSIS DIP (MANUAL ENTRY)
Bilirubin, UA: NEGATIVE
Blood, UA: NEGATIVE
Glucose, UA: NEGATIVE mg/dL
Ketones, POC UA: NEGATIVE mg/dL
Leukocytes, UA: NEGATIVE
Nitrite, UA: NEGATIVE
Protein Ur, POC: NEGATIVE mg/dL
Spec Grav, UA: >=1.03 — AB (ref 1.010–1.025)
Urobilinogen, UA: 0.2 U/dL
pH, UA: 7 (ref 5.0–8.0)

## 2023-04-15 LAB — POCT URINE PREGNANCY: Preg Test, Ur: NEGATIVE

## 2023-04-15 MED ORDER — DOXYCYCLINE HYCLATE 100 MG PO CAPS: 100.0000 mg | ORAL_CAPSULE | Freq: Two times a day (BID) | ORAL | 0 refills | Status: AC

## 2023-04-15 NOTE — Discharge Instructions (Addendum)
The clinic will contact you with results of the testing done today if positive.  Start doxycycline twice daily for 7 days given your exposure to chlamydia.  Please follow-up with your PCP or gynecologist if symptoms do not improve.  Please go to the ER for any worsening symptoms.  I hope you feel better soon!

## 2023-04-15 NOTE — ED Triage Notes (Signed)
Pt presents to UC w/ c/o frequent urination. Denies dysuria or abd pain. Pt reports Ex boyfriend tested positive for chalmydia

## 2023-04-15 NOTE — ED Provider Notes (Signed)
[facility]    CSN: [id number] Arrival date & time: 04/15/23  1521      History   Chief Complaint Chief Complaint  Patient presents with   SEXUALLY TRANSMITTED DISEASE    partner tested positive for chlamydia. - Entered by patient    HPI Sarah Luna is a 22 y.o. female presents for vaginal discharge and STD exposure.  Patient reports 2 days of urinary frequency as well as vaginal discharge.  Denies any dysuria, hematuria, fevers, nausea/vomiting, flank pain.  Reports she has been exposed to chlamydia.  No OTC medications have been used for symptoms.  No other concerns at this time.  HPI  History reviewed. No pertinent past medical history.  Patient Active Problem List   Diagnosis Date Noted   Cigar smoker 06/04/2021   Elevated blood pressure reading 06/04/2021   History of hepatitis C 02/11/2020   Nexplanon in place 02/08/2020   Pre-eclampsia in third trimester 11/08/2017   Obesity (BMI 30.0-34.9) 02/23/2014    Past Surgical History:  Procedure Laterality Date   NO PAST SURGERIES      OB History     Gravida  1   Para  1   Term      Preterm      AB      Living  1      SAB      IAB      Ectopic      Multiple  0   Live Births  1            Home Medications    Prior to Admission medications   Medication Sig Start Date End Date Taking? Authorizing Provider  doxycycline (VIBRAMYCIN) 100 MG capsule Take 1 capsule (100 mg total) by mouth 2 (two) times daily for 7 days. 04/15/23 04/22/23 Yes Radford Pax, NP  acetaminophen (TYLENOL) 325 MG tablet Take 2 tablets (650 mg total) by mouth every 4 (four) hours as needed (for pain scale < 4). Patient not taking: Reported on 12/08/2017 11/10/17   Constant, Peggy, MD  amLODipine (NORVASC) 5 MG tablet Take 1 tablet (5 mg total) by mouth daily. 12/08/17   Conan Bowens, MD  Etonogestrel [facility]) Inject into the skin. Patient not taking: Reported on 06/04/2021 11/10/17   [provider]  fosinopril-hydrochlorothiazide (MONOPRIL-HCT) 20-12.5 MG tablet Take 1 tablet by mouth daily. 11/20/17   Marylene Land, CNM  ibuprofen (ADVIL,MOTRIN) 600 MG tablet Take 1 tablet (600 mg total) by mouth every 6 (six) hours. 11/10/17   Constant, Peggy, MD  metroNIDAZOLE (FLAGYL) 500 MG tablet Take 1 tablet (500 mg total) by mouth 2 (two) times daily. No alcohol while taking this medication.  Begin the next day after taking Azithromycin. 02/11/20   Currie Paris, NP    Family History Family History  Problem Relation Age of Onset   Diabetes Maternal Grandmother    Hypertension Maternal Grandmother     Social History Social History   Tobacco Use   Smoking status: Every Day    Types: Cigars   Smokeless tobacco: Never  Vaping Use   Vaping status: Never Used  Substance Use Topics   Alcohol use: Never   Drug use: Never     Allergies   Patient has no known allergies.   Review of Systems Review of Systems  Genitourinary:  Positive for vaginal discharge.     Physical Exam Triage Vital Signs ED Triage Vitals  Encounter Vitals Group  BP 04/15/23 1537 129/87     Systolic BP Percentile --      Diastolic BP Percentile --      Pulse Rate 04/15/23 1537 (!) 58     Resp 04/15/23 1537 16     Temp 04/15/23 1537 98.6 F (37 C)     Temp Source 04/15/23 1537 Oral     SpO2 04/15/23 1537 98 %     Weight --      Height --      Head Circumference --      Peak Flow --      Pain Score 04/15/23 1535 0     Pain Loc --      Pain Education --      Exclude from Growth Chart --    No data found.  Updated Vital Signs BP 129/87 (BP Location: Left Arm)   Pulse (!) 58   Temp 98.6 F (37 C) (Oral)   Resp 16   LMP 04/05/2023 (Exact Date)   SpO2 98%   Visual Acuity Right Eye Distance:   Left Eye Distance:   Bilateral Distance:    Right Eye Near:   Left Eye Near:    Bilateral Near:     Physical Exam Vitals and nursing note reviewed.   Constitutional:      Appearance: Normal appearance.  HENT:     Head: Normocephalic and atraumatic.  Eyes:     Pupils: Pupils are equal, round, and reactive to light.  Cardiovascular:     Rate and Rhythm: Bradycardia present.  Pulmonary:     Effort: Pulmonary effort is normal.  Abdominal:     Tenderness: There is no right CVA tenderness or left CVA tenderness.  Skin:    General: Skin is warm and dry.  Neurological:     General: No focal deficit present.     Mental Status: She is alert and oriented to person, place, and time.  Psychiatric:        Mood and Affect: Mood normal.        Behavior: Behavior normal.      UC Treatments / Results  Labs (all labs ordered are listed, but only abnormal results are displayed) Labs Reviewed  POCT URINALYSIS DIP (MANUAL ENTRY) - Abnormal; Notable for the following components:      Result Value   Clarity, UA hazy (*)    Spec Grav, UA >=1.030 (*)    All other components within normal limits  RPR  HIV ANTIBODY (ROUTINE TESTING W REFLEX)  POCT URINE PREGNANCY  CERVICOVAGINAL ANCILLARY ONLY    EKG   Radiology No results found.  Procedures Procedures (including critical care time)  Medications Ordered in UC Medications - No data to display  Initial Impression / Assessment and Plan / UC Course  I have reviewed the triage vital signs and the nursing notes.  Pertinent labs & imaging results that were available during my care of the patient were reviewed by me and considered in my medical decision making (see chart for details).     Reviewed exam and symptoms with patient.  No red flags.  UA negative for UTI.  Negative for urine hCG.  STD testing is ordered and will contact for any positive results.  Given exposure to chlamydia will start doxycycline twice daily for 7 days.  Advised PCP or GYN follow-up as symptoms do not improve.  ER precautions reviewed. Final Clinical Impressions(s) / UC Diagnoses   Final diagnoses:  Exposure  to chlamydia  Vaginal discharge  Screening examination for STD (sexually transmitted disease)     Discharge Instructions      The clinic will contact you with results of the testing done today if positive.  Start doxycycline twice daily for 7 days given your exposure to chlamydia.  Please follow-up with your PCP or gynecologist if symptoms do not improve.  Please go to the ER for any worsening symptoms.  I hope you feel better soon!    ED Prescriptions     Medication Sig Dispense Auth. Provider   doxycycline (VIBRAMYCIN) 100 MG capsule Take 1 capsule (100 mg total) by mouth 2 (two) times daily for 7 days. 14 capsule Radford Pax, NP      PDMP not reviewed this encounter.   Radford Pax, NP 04/15/23 [phone]

## 2023-04-16 LAB — CERVICOVAGINAL ANCILLARY ONLY
Bacterial Vaginitis (gardnerella): POSITIVE — AB
Candida Glabrata: NEGATIVE
Candida Vaginitis: POSITIVE — AB
Chlamydia: NEGATIVE
Comment: NEGATIVE
Comment: NEGATIVE
Comment: NEGATIVE
Comment: NEGATIVE
Comment: NEGATIVE
Comment: NORMAL
Neisseria Gonorrhea: NEGATIVE
Trichomonas: NEGATIVE

## 2023-04-16 LAB — HIV ANTIBODY (ROUTINE TESTING W REFLEX): HIV Screen 4th Generation wRfx: NONREACTIVE

## 2023-04-16 LAB — RPR: RPR Ser Ql: NONREACTIVE

## 2023-04-17 ENCOUNTER — Telehealth (HOSPITAL_COMMUNITY): Payer: Self-pay | Admitting: Emergency Medicine

## 2023-04-17 MED ORDER — METRONIDAZOLE 500 MG PO TABS: 500.0000 mg | ORAL_TABLET | Freq: Two times a day (BID) | ORAL | 0 refills | Status: DC

## 2023-04-17 MED ORDER — FLUCONAZOLE 150 MG PO TABS: 150.0000 mg | ORAL_TABLET | Freq: Once | ORAL | 0 refills | Status: AC

## 2023-04-17 NOTE — Telephone Encounter (Signed)
Metronidazole for positive BV Diflucan for positive yeast

## 2023-06-24 NOTE — L&D Delivery Note (Signed)
 Labor Progress Sarah Luna is a 23 y.o. female [phone] with IUP at [redacted]w[redacted]d admitted for SOL.  She progressed with augmentation to complete and pushed with one contraction to deliver.  Cord clamping delayed by 1 minute then clamped by CNM and cut by FOB.  Placenta intact and spontaneous, bleeding minimal.  Large amount of thick particulate meconium after delivery, with none seen prior.  Intact perineum, no repair. Infant with slow transition, deep suctioned r/t meconium, NICU team present for delivery r/t decelerations stayed and stabilized infant prior to transfer to NICU.  Mom stable prior to transfer to postpartum. She plans on breastfeeding. She requests POPs for birth control.  Delivery Note At 11:46 AM a viable and healthy female was delivered via Vaginal, Spontaneous (Presentation:   Occiput Anterior).  APGAR: 4, 5; weight 8 lb 1.5 oz (3670 g).   Placenta status: Spontaneous, Intact.  Cord: 3 vessels with the following complications: None.  Cord pH: 7.37  Anesthesia: Epidural Episiotomy: None Lacerations: None Suture Repair: n/a Est. Blood Loss (mL): 84  Mom to antepartum.  Baby to NICU.  Olam Boards 04/02/2024, 6:27 PM

## 2023-08-26 ENCOUNTER — Other Ambulatory Visit: Payer: Self-pay

## 2023-08-26 ENCOUNTER — Ambulatory Visit (INDEPENDENT_AMBULATORY_CARE_PROVIDER_SITE_OTHER): Payer: Self-pay | Admitting: *Deleted

## 2023-08-26 VITALS — BP 131/88 | HR 76 | Wt 129.0 lb

## 2023-08-26 DIAGNOSIS — O0990 Supervision of high risk pregnancy, unspecified, unspecified trimester: Secondary | ICD-10-CM | POA: Insufficient documentation

## 2023-08-26 DIAGNOSIS — Z1339 Encounter for screening examination for other mental health and behavioral disorders: Secondary | ICD-10-CM

## 2023-08-26 DIAGNOSIS — O0991 Supervision of high risk pregnancy, unspecified, first trimester: Secondary | ICD-10-CM

## 2023-08-26 DIAGNOSIS — Z3A08 8 weeks gestation of pregnancy: Secondary | ICD-10-CM

## 2023-08-26 DIAGNOSIS — O3680X0 Pregnancy with inconclusive fetal viability, not applicable or unspecified: Secondary | ICD-10-CM

## 2023-08-26 HISTORY — DX: Supervision of high risk pregnancy, unspecified, unspecified trimester: O09.90

## 2023-08-26 MED ORDER — VITAFOL GUMMIES 3.33-0.333-34.8 MG PO CHEW: 1.0000 | CHEWABLE_TABLET | Freq: Every day | ORAL | 5 refills | Status: DC

## 2023-08-26 MED ORDER — BLOOD PRESSURE KIT DEVI: 1.0000 | 0 refills | Status: AC

## 2023-08-26 NOTE — Patient Instructions (Signed)

## 2023-08-26 NOTE — Progress Notes (Signed)
 New OB Intake  I connected with Sarah Luna  on 08/26/23 at  9:15 AM EST In Person Visit and verified that I am speaking with the correct person using two identifiers. Nurse is located at [facility] and pt is located at [facility].  I discussed the limitations, risks, security and privacy concerns of performing an evaluation and management service by telephone and the availability of in person appointments. I also discussed with the patient that there may be a patient responsible charge related to this service. The patient expressed understanding and agreed to proceed.  I explained I am completing New OB Intake today. We discussed EDD of 04/02/2024, by Last Menstrual Period. Pt is G2P0001. I reviewed her allergies, medications and Medical/Surgical/OB history.    Patient Active Problem List   Diagnosis Date Noted   Cigar smoker 06/04/2021   Elevated blood pressure reading 06/04/2021   History of hepatitis C 02/11/2020   Nexplanon in place 02/08/2020   Pre-eclampsia in third trimester 11/08/2017   Obesity (BMI 30.0-34.9) 02/23/2014    Concerns addressed today  Delivery Plans Plans to deliver at [facility]. Discussed the nature of our practice with multiple providers including residents and students. Due to the size of the practice, the delivering provider may not be the same as those providing prenatal care.   Patient is not interested in water birth. Offered upcoming OB visit with CNM to discuss further.  MyChart/Babyscripts MyChart access verified. I explained pt will have some visits in office and some virtually. Babyscripts instructions given and order placed. Patient verifies receipt of registration text/e-mail. Account successfully created and app downloaded. If patient is a candidate for Optimized scheduling, add to sticky note.   Blood Pressure Cuff/Weight Scale Blood pressure cuff ordered for patient to pick-up from [employer]. Explained after first prenatal appt pt will  check weekly and document in Babyscripts. Patient does not have weight scale; patient may purchase if they desire to track weight weekly in Babyscripts.  Anatomy US Explained first scheduled [place] will be around 19 weeks. Anatomy US scheduled for TBD at TBD.  Interested in [place]? If yes, send referral and doula dot phrase.   Is patient a candidate for Babyscripts Optimization? No, due to Risk Factors   First visit review I reviewed new OB appt with patient. Explained pt will be seen by Dr. Marice Potter at first visit. Discussed Avelina Laine genetic screening with patient. Requests Panorama and Horizon.. Routine prenatal labs  deferred to New OB appt.     Last Pap No results found for: "DIAGPAP"  Harrel Lemon, RN 08/26/2023  10:09 AM

## 2023-09-22 ENCOUNTER — Ambulatory Visit (INDEPENDENT_AMBULATORY_CARE_PROVIDER_SITE_OTHER): Payer: 59 | Admitting: Obstetrics & Gynecology

## 2023-09-22 ENCOUNTER — Other Ambulatory Visit (HOSPITAL_COMMUNITY)
Admission: RE | Admit: 2023-09-22 | Discharge: 2023-09-22 | Disposition: A | Discharge status: Home / Self Care | Payer: MEDICAID | Source: Ambulatory Visit | Attending: Obstetrics & Gynecology | Admitting: Obstetrics & Gynecology

## 2023-09-22 VITALS — BP 125/85 | HR 88 | Wt 140.9 lb

## 2023-09-22 DIAGNOSIS — Z3491 Encounter for supervision of normal pregnancy, unspecified, first trimester: Secondary | ICD-10-CM | POA: Insufficient documentation

## 2023-09-22 DIAGNOSIS — O09291 Supervision of pregnancy with other poor reproductive or obstetric history, first trimester: Secondary | ICD-10-CM

## 2023-09-22 DIAGNOSIS — O0990 Supervision of high risk pregnancy, unspecified, unspecified trimester: Secondary | ICD-10-CM

## 2023-09-22 DIAGNOSIS — Z3481 Encounter for supervision of other normal pregnancy, first trimester: Secondary | ICD-10-CM | POA: Diagnosis not present

## 2023-09-22 DIAGNOSIS — Z3A12 12 weeks gestation of pregnancy: Secondary | ICD-10-CM

## 2023-09-22 DIAGNOSIS — Z1339 Encounter for screening examination for other mental health and behavioral disorders: Secondary | ICD-10-CM

## 2023-09-22 DIAGNOSIS — O09299 Supervision of pregnancy with other poor reproductive or obstetric history, unspecified trimester: Secondary | ICD-10-CM

## 2023-09-22 MED ORDER — ASPIRIN 81 MG PO CHEW: 81.0000 mg | CHEWABLE_TABLET | Freq: Every day | ORAL | 3 refills | Status: DC

## 2023-09-22 NOTE — Progress Notes (Signed)
  Subjective:    Sarah Luna is a  23 yo G17P0001 (23 yo Sarah Luna) at [redacted]w[redacted]d being seen today for her first obstetrical visit.  Her obstetrical history is significant for  h/o pre e with IOL at 33 weeks with her last pregnancy. She gained 50 pounds in that pregnancy . Patient is not sure if she intends to breast feed. Pregnancy history fully reviewed.  Patient reports no complaints.  Vitals:   09/22/23 1029  BP: 125/85  Pulse: 88  Weight: 140 lb 14.4 oz (63.9 kg)    HISTORY: OB History  Gravida Para Term Preterm AB Living  2 1 0 0 0 1  SAB IAB Ectopic Multiple Live Births  0 0 0 0 1    # Outcome Date GA Lbr Len/2nd Weight Sex Type Anes PTL Lv  2 Current           1 Para 11/08/17  09:40 / 00:07 6 lb 5.9 oz (2.89 kg) F Vag-Spont EPI  LIV    Obstetric Comments  11/08/2017 infant delivered at approximately 34 wks by [place] on admission. Severe pre E and pPROM. IOL. Vaginal delivery.   Past Medical History:  Diagnosis Date   Medical history non-contributory    Past Surgical History:  Procedure Laterality Date   NO PAST SURGERIES     Family History  Problem Relation Age of Onset   Diabetes Maternal Grandmother    Hypertension Maternal Grandmother      Exam    Uterus:     Pelvic Exam:    Perineum: No Hemorrhoids   Vulva: normal   Vagina:  normal mucosa   pH:    Cervix: anteverted   Adnexa: normal adnexa   Bony Pelvis: android  System: Breast:  normal appearance, no masses or tenderness   Skin: normal coloration and turgor, no rashes    Neurologic: oriented   Extremities: normal strength, tone, and muscle mass   HEENT PERRLA   Mouth/Teeth mucous membranes moist, pharynx normal without lesions   Neck supple   Cardiovascular: regular rate and rhythm, 2/6 SEM   Respiratory:  appears well, vitals normal, no respiratory distress, acyanotic, normal RR, ear and throat exam is normal, neck free of mass or lymphadenopathy, chest clear, no wheezing, crepitations, rhonchi,  normal symmetric air entry   Abdomen: soft, non-tender; bowel sounds normal; no masses,  no organomegaly   Urinary: urethral meatus normal      Assessment:    Pregnancy: G2P0001 Patient Active Problem List   Diagnosis Date Noted   Supervision of high risk pregnancy, antepartum 08/26/2023   Cigar smoker 06/04/2021   Elevated blood pressure reading 06/04/2021   History of hepatitis C 02/11/2020   Nexplanon in place 02/08/2020   Pre-eclampsia in third trimester 11/08/2017   Obesity (BMI 30.0-34.9) 02/23/2014        Plan:     Initial labs drawn. Prenatal vitamins. Problem list reviewed and updated. Genetic Screening discussed and requested: NIPS and Horizon ordered Ultrasound discussed; fetal survey: ordered. Recommended that she start baby asa daily and gain about 15 pounds in pregnancy, want to prevent/delay Pre e with this pregnancy. Pap done Follow up in 4 weeks.   Sarah Luna 09/22/2023

## 2023-09-22 NOTE — Progress Notes (Signed)
 Pt present for new ob. Pt has no questions or concerns at this time.

## 2023-09-23 LAB — CBC/D/PLT+RPR+RH+ABO+RUBIGG...
Antibody Screen: NEGATIVE
Basophils Absolute: 0 10*3/uL (ref 0.0–0.2)
Basos: 0 %
EOS (ABSOLUTE): 0 10*3/uL (ref 0.0–0.4)
Eos: 0 %
HCV Ab: NONREACTIVE
HIV Screen 4th Generation wRfx: NONREACTIVE
Hematocrit: 33.9 % — ABNORMAL LOW (ref 34.0–46.6)
Hemoglobin: 11.3 g/dL (ref 11.1–15.9)
Hepatitis B Surface Ag: NEGATIVE
Immature Grans (Abs): 0 10*3/uL (ref 0.0–0.1)
Immature Granulocytes: 0 %
Lymphocytes Absolute: 0.7 10*3/uL (ref 0.7–3.1)
Lymphs: 6 %
MCH: 28.9 pg (ref 26.6–33.0)
MCHC: 33.3 g/dL (ref 31.5–35.7)
MCV: 87 fL (ref 79–97)
Monocytes Absolute: 0.7 10*3/uL (ref 0.1–0.9)
Monocytes: 6 %
Neutrophils Absolute: 9.8 10*3/uL — ABNORMAL HIGH (ref 1.4–7.0)
Neutrophils: 88 %
Platelets: 212 10*3/uL (ref 150–450)
RBC: 3.91 x10E6/uL (ref 3.77–5.28)
RDW: 14.1 % (ref 11.7–15.4)
RPR Ser Ql: NONREACTIVE
Rh Factor: POSITIVE
Rubella Antibodies, IGG: 2.35 {index} (ref >0.99)
WBC: 11.3 10*3/uL — ABNORMAL HIGH (ref 3.4–10.8)

## 2023-09-23 LAB — CERVICOVAGINAL ANCILLARY ONLY
Bacterial Vaginitis (gardnerella): POSITIVE — AB
Candida Glabrata: NEGATIVE
Candida Vaginitis: POSITIVE — AB
Chlamydia: POSITIVE — AB
Comment: NEGATIVE
Comment: NEGATIVE
Comment: NEGATIVE
Comment: NEGATIVE
Comment: NEGATIVE
Comment: NORMAL
Neisseria Gonorrhea: NEGATIVE
Trichomonas: NEGATIVE

## 2023-09-23 LAB — COMPREHENSIVE METABOLIC PANEL WITH GFR
ALT: 10 IU/L (ref 0–32)
AST: 13 IU/L (ref 0–40)
Albumin: 4 g/dL (ref 4.0–5.0)
Alkaline Phosphatase: 37 IU/L — ABNORMAL LOW (ref 44–121)
BUN/Creatinine Ratio: 11 (ref 9–23)
BUN: 5 mg/dL — ABNORMAL LOW (ref 6–20)
Bilirubin Total: 0.3 mg/dL (ref 0.0–1.2)
CO2: 18 mmol/L — ABNORMAL LOW (ref 20–29)
Calcium: 8.8 mg/dL (ref 8.7–10.2)
Chloride: 101 mmol/L (ref 96–106)
Creatinine, Ser: 0.44 mg/dL — ABNORMAL LOW (ref 0.57–1.00)
Globulin, Total: 2.5 g/dL (ref 1.5–4.5)
Glucose: 78 mg/dL (ref 70–99)
Potassium: 4.1 mmol/L (ref 3.5–5.2)
Sodium: 133 mmol/L — ABNORMAL LOW (ref 134–144)
Total Protein: 6.5 g/dL (ref 6.0–8.5)
eGFR: 139 mL/min/{1.73_m2} (ref >59)

## 2023-09-23 LAB — PROTEIN / CREATININE RATIO, URINE
Creatinine, Urine: 147.4 mg/dL
Protein, Ur: 41.8 mg/dL
Protein/Creat Ratio: 284 mg/g{creat} — ABNORMAL HIGH (ref 0–200)

## 2023-09-23 LAB — TSH+FREE T4
Free T4: 0.77 ng/dL — ABNORMAL LOW (ref 0.82–1.77)
TSH: 1.1 u[IU]/mL (ref 0.450–4.500)

## 2023-09-23 LAB — HCV INTERPRETATION

## 2023-09-24 LAB — CYTOLOGY - PAP: Diagnosis: NEGATIVE

## 2023-09-24 LAB — CULTURE, OB URINE

## 2023-09-24 LAB — URINE CULTURE, OB REFLEX

## 2023-09-25 ENCOUNTER — Other Ambulatory Visit: Payer: Self-pay

## 2023-09-25 MED ORDER — TERCONAZOLE 0.8 % VA CREA: 1.0000 | TOPICAL_CREAM | Freq: Every day | VAGINAL | 0 refills | Status: AC

## 2023-09-25 MED ORDER — METRONIDAZOLE 500 MG PO TABS: 500.0000 mg | ORAL_TABLET | Freq: Two times a day (BID) | ORAL | 0 refills | Status: DC

## 2023-09-25 MED ORDER — AZITHROMYCIN 250 MG PO TABS: 1000.0000 mg | ORAL_TABLET | Freq: Once | ORAL | 0 refills | Status: AC

## 2023-09-25 NOTE — Progress Notes (Signed)
 Attempted top contact about results, no answer, left vm.  Rx sent per protocol Form faxed to [facility]

## 2023-09-28 ENCOUNTER — Inpatient Hospital Stay (HOSPITAL_COMMUNITY)
Admission: AD | Admit: 2023-09-28 | Discharge: 2023-09-28 | Disposition: A | Discharge status: Home / Self Care | Payer: MEDICAID | Attending: Obstetrics and Gynecology | Admitting: Obstetrics and Gynecology

## 2023-09-28 ENCOUNTER — Inpatient Hospital Stay (HOSPITAL_COMMUNITY): Payer: Self-pay

## 2023-09-28 ENCOUNTER — Telehealth: Payer: Self-pay

## 2023-09-28 ENCOUNTER — Encounter (HOSPITAL_COMMUNITY): Payer: Self-pay | Admitting: Obstetrics and Gynecology

## 2023-09-28 DIAGNOSIS — O98311 Other infections with a predominantly sexual mode of transmission complicating pregnancy, first trimester: Secondary | ICD-10-CM | POA: Insufficient documentation

## 2023-09-28 DIAGNOSIS — O98811 Other maternal infectious and parasitic diseases complicating pregnancy, first trimester: Secondary | ICD-10-CM | POA: Insufficient documentation

## 2023-09-28 DIAGNOSIS — O209 Hemorrhage in early pregnancy, unspecified: Secondary | ICD-10-CM | POA: Diagnosis not present

## 2023-09-28 DIAGNOSIS — O9A311 Physical abuse complicating pregnancy, first trimester: Secondary | ICD-10-CM | POA: Diagnosis not present

## 2023-09-28 DIAGNOSIS — T7491XA Unspecified adult maltreatment, confirmed, initial encounter: Secondary | ICD-10-CM

## 2023-09-28 DIAGNOSIS — A749 Chlamydial infection, unspecified: Secondary | ICD-10-CM

## 2023-09-28 DIAGNOSIS — A5611 Chlamydial female pelvic inflammatory disease: Secondary | ICD-10-CM | POA: Diagnosis not present

## 2023-09-28 DIAGNOSIS — Z3A13 13 weeks gestation of pregnancy: Secondary | ICD-10-CM | POA: Diagnosis not present

## 2023-09-28 DIAGNOSIS — S0011XA Contusion of right eyelid and periocular area, initial encounter: Secondary | ICD-10-CM | POA: Insufficient documentation

## 2023-09-28 DIAGNOSIS — R1030 Lower abdominal pain, unspecified: Secondary | ICD-10-CM | POA: Diagnosis present

## 2023-09-28 DIAGNOSIS — B9689 Other specified bacterial agents as the cause of diseases classified elsewhere: Secondary | ICD-10-CM

## 2023-09-28 DIAGNOSIS — N760 Acute vaginitis: Secondary | ICD-10-CM | POA: Diagnosis present

## 2023-09-28 DIAGNOSIS — B379 Candidiasis, unspecified: Secondary | ICD-10-CM

## 2023-09-28 HISTORY — DX: Essential (primary) hypertension: I10

## 2023-09-28 LAB — URINALYSIS, ROUTINE W REFLEX MICROSCOPIC
Bacteria, UA: NONE SEEN
Bilirubin Urine: NEGATIVE
Glucose, UA: NEGATIVE mg/dL
Ketones, ur: 20 mg/dL — AB
Nitrite: NEGATIVE
Protein, ur: 100 mg/dL — AB
RBC / HPF: >50 RBC/hpf (ref 0–5)
Specific Gravity, Urine: 1.02 (ref 1.005–1.030)
pH: 6 (ref 5.0–8.0)
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
## 2023-09-28 NOTE — Telephone Encounter (Signed)
 TC from pt reporting going to bathroom at work, and pants saturated with blood. Pt reports blood is still heavily coming out currently. Informed pt to go into MAU as soon as she can to be evaluated since she is experiencing this much bleeding. Pt reports understanding.
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
## 2023-09-30 LAB — PANORAMA PRENATAL TEST FULL PANEL:PANORAMA TEST PLUS 5 ADDITIONAL MICRODELETIONS: FETAL FRACTION: 7.7

## 2023-10-02 LAB — HORIZON CUSTOM: REPORT SUMMARY: POSITIVE — AB

## 2023-10-07 ENCOUNTER — Encounter: Payer: Self-pay | Admitting: Obstetrics & Gynecology

## 2023-10-07 DIAGNOSIS — Z148 Genetic carrier of other disease: Secondary | ICD-10-CM | POA: Insufficient documentation

## 2023-10-20 ENCOUNTER — Ambulatory Visit: Admitting: Advanced Practice Midwife

## 2023-10-20 ENCOUNTER — Encounter: Payer: Self-pay | Admitting: Advanced Practice Midwife

## 2023-10-20 ENCOUNTER — Other Ambulatory Visit (HOSPITAL_COMMUNITY)
Admission: RE | Admit: 2023-10-20 | Discharge: 2023-10-20 | Disposition: A | Discharge status: Home / Self Care | Source: Ambulatory Visit | Attending: Advanced Practice Midwife | Admitting: Advanced Practice Midwife

## 2023-10-20 VITALS — BP 130/81 | HR 85 | Wt 144.0 lb

## 2023-10-20 DIAGNOSIS — O09292 Supervision of pregnancy with other poor reproductive or obstetric history, second trimester: Secondary | ICD-10-CM | POA: Diagnosis not present

## 2023-10-20 DIAGNOSIS — O98812 Other maternal infectious and parasitic diseases complicating pregnancy, second trimester: Secondary | ICD-10-CM | POA: Diagnosis not present

## 2023-10-20 DIAGNOSIS — O98811 Other maternal infectious and parasitic diseases complicating pregnancy, first trimester: Secondary | ICD-10-CM | POA: Insufficient documentation

## 2023-10-20 DIAGNOSIS — Z8759 Personal history of other complications of pregnancy, childbirth and the puerperium: Secondary | ICD-10-CM

## 2023-10-20 DIAGNOSIS — O09299 Supervision of pregnancy with other poor reproductive or obstetric history, unspecified trimester: Secondary | ICD-10-CM | POA: Insufficient documentation

## 2023-10-20 DIAGNOSIS — Z3A16 16 weeks gestation of pregnancy: Secondary | ICD-10-CM

## 2023-10-20 DIAGNOSIS — A749 Chlamydial infection, unspecified: Secondary | ICD-10-CM

## 2023-10-20 DIAGNOSIS — O0992 Supervision of high risk pregnancy, unspecified, second trimester: Secondary | ICD-10-CM

## 2023-10-20 DIAGNOSIS — O0990 Supervision of high risk pregnancy, unspecified, unspecified trimester: Secondary | ICD-10-CM

## 2023-10-20 DIAGNOSIS — T7491XA Unspecified adult maltreatment, confirmed, initial encounter: Secondary | ICD-10-CM | POA: Insufficient documentation

## 2023-10-20 DIAGNOSIS — T7491XD Unspecified adult maltreatment, confirmed, subsequent encounter: Secondary | ICD-10-CM

## 2023-10-20 HISTORY — DX: Supervision of pregnancy with other poor reproductive or obstetric history, unspecified trimester: O09.299

## 2023-10-20 HISTORY — DX: Chlamydial infection, unspecified: A74.9

## 2023-10-20 HISTORY — DX: Personal history of other complications of pregnancy, childbirth and the puerperium: Z87.59

## 2023-10-20 NOTE — Progress Notes (Signed)
Pt has no other concerns at this time.
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
## 2023-10-21 LAB — CERVICOVAGINAL ANCILLARY ONLY
Bacterial Vaginitis (gardnerella): NEGATIVE
Candida Glabrata: NEGATIVE
Candida Vaginitis: POSITIVE — AB
Chlamydia: NEGATIVE
Comment: NEGATIVE
Comment: NEGATIVE
Comment: NEGATIVE
Comment: NEGATIVE
Comment: NEGATIVE
Comment: NORMAL
Neisseria Gonorrhea: NEGATIVE
Trichomonas: NEGATIVE

## 2023-10-22 ENCOUNTER — Encounter: Payer: Self-pay | Admitting: Advanced Practice Midwife

## 2023-10-22 LAB — AFP, SERUM, OPEN SPINA BIFIDA
AFP MoM: 1.74
AFP Value: 64.9 ng/mL
Gest. Age on Collection Date: 16 wk
Maternal Age At EDD: 24.2 a
OSBR Risk 1 IN: 2954
Test Results:: NEGATIVE
Weight: 144 [lb_av]

## 2023-10-22 MED ORDER — TERCONAZOLE 0.4 % VA CREA
1.0000 | TOPICAL_CREAM | Freq: Every day | VAGINAL | 0 refills | Status: DC
Start: 2023-10-22 — End: 2024-04-04

## 2023-10-22 NOTE — Addendum Note (Signed)
 Addended by: Arlester Bence A on: 10/22/2023 07:22 PM   Modules accepted: Orders

## 2023-11-02 DIAGNOSIS — D563 Thalassemia minor: Secondary | ICD-10-CM | POA: Insufficient documentation

## 2023-11-02 HISTORY — DX: Thalassemia minor: D56.3

## 2023-11-05 ENCOUNTER — Ambulatory Visit: Payer: MEDICAID

## 2023-11-10 ENCOUNTER — Ambulatory Visit: Payer: Self-pay | Attending: Obstetrics and Gynecology | Admitting: Maternal & Fetal Medicine

## 2023-11-10 ENCOUNTER — Ambulatory Visit

## 2023-11-10 ENCOUNTER — Encounter: Admitting: Advanced Practice Midwife

## 2023-11-10 ENCOUNTER — Ambulatory Visit (HOSPITAL_BASED_OUTPATIENT_CLINIC_OR_DEPARTMENT_OTHER): Payer: Self-pay

## 2023-11-10 ENCOUNTER — Other Ambulatory Visit: Payer: Self-pay | Admitting: Obstetrics and Gynecology

## 2023-11-10 VITALS — BP 118/69

## 2023-11-10 DIAGNOSIS — D563 Thalassemia minor: Secondary | ICD-10-CM | POA: Diagnosis not present

## 2023-11-10 DIAGNOSIS — O09292 Supervision of pregnancy with other poor reproductive or obstetric history, second trimester: Secondary | ICD-10-CM | POA: Insufficient documentation

## 2023-11-10 DIAGNOSIS — Z3A19 19 weeks gestation of pregnancy: Secondary | ICD-10-CM

## 2023-11-10 DIAGNOSIS — Z148 Genetic carrier of other disease: Secondary | ICD-10-CM | POA: Insufficient documentation

## 2023-11-10 DIAGNOSIS — O0990 Supervision of high risk pregnancy, unspecified, unspecified trimester: Secondary | ICD-10-CM

## 2023-11-10 DIAGNOSIS — Z363 Encounter for antenatal screening for malformations: Secondary | ICD-10-CM | POA: Diagnosis not present

## 2023-11-10 DIAGNOSIS — O09299 Supervision of pregnancy with other poor reproductive or obstetric history, unspecified trimester: Secondary | ICD-10-CM

## 2023-11-10 DIAGNOSIS — O99012 Anemia complicating pregnancy, second trimester: Secondary | ICD-10-CM
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
## 2023-11-12 LAB — LUPUS ANTICOAGULANT
Dilute Viper Venom Time: 21.4 s (ref 0.0–47.0)
PTT Lupus Anticoagulant: 29.2 s (ref 0.0–43.5)
Thrombin Time: 16.7 s (ref 0.0–23.0)
dPT Confirm Ratio: 0.81 ratio (ref 0.00–1.34)
dPT: 20.7 s (ref 0.0–47.6)

## 2023-11-12 LAB — CARDIOLIPIN ANTIBODIES, IGM+IGG
Anticardiolipin IgG: <9 GPL U/mL (ref 0–14)
Anticardiolipin IgM: 28 [MPL'U]/mL — ABNORMAL HIGH (ref 0–12)

## 2023-11-12 LAB — BETA-2-GLYCOPROTEIN I ABS, IGG/M/A
Beta-2 Glyco 1 IgA: <9 GPI IgA units (ref 0–25)
Beta-2 Glyco 1 IgM: <9 GPI IgM units (ref 0–32)
Beta-2 Glyco I IgG: <9 GPI IgG units (ref 0–20)

## 2023-12-14 ENCOUNTER — Ambulatory Visit (INDEPENDENT_AMBULATORY_CARE_PROVIDER_SITE_OTHER): Admitting: Physician Assistant

## 2023-12-14 VITALS — BP 119/81 | HR 79

## 2023-12-14 DIAGNOSIS — O0990 Supervision of high risk pregnancy, unspecified, unspecified trimester: Secondary | ICD-10-CM | POA: Diagnosis not present

## 2023-12-14 DIAGNOSIS — Z8759 Personal history of other complications of pregnancy, childbirth and the puerperium: Secondary | ICD-10-CM

## 2023-12-14 DIAGNOSIS — O09299 Supervision of pregnancy with other poor reproductive or obstetric history, unspecified trimester: Secondary | ICD-10-CM | POA: Diagnosis not present

## 2023-12-14 DIAGNOSIS — Z3A24 24 weeks gestation of pregnancy: Secondary | ICD-10-CM

## 2023-12-14 NOTE — Progress Notes (Signed)
 Denies problems today.
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
## 2024-01-11 ENCOUNTER — Other Ambulatory Visit

## 2024-01-11 ENCOUNTER — Ambulatory Visit (INDEPENDENT_AMBULATORY_CARE_PROVIDER_SITE_OTHER): Admitting: Advanced Practice Midwife

## 2024-01-11 ENCOUNTER — Encounter: Payer: Self-pay | Admitting: Advanced Practice Midwife

## 2024-01-11 VITALS — BP 121/80 | HR 73 | Wt 167.0 lb

## 2024-01-11 DIAGNOSIS — O09299 Supervision of pregnancy with other poor reproductive or obstetric history, unspecified trimester: Secondary | ICD-10-CM | POA: Diagnosis not present

## 2024-01-11 DIAGNOSIS — Z3A28 28 weeks gestation of pregnancy: Secondary | ICD-10-CM | POA: Diagnosis not present

## 2024-01-11 DIAGNOSIS — Z8759 Personal history of other complications of pregnancy, childbirth and the puerperium: Secondary | ICD-10-CM | POA: Diagnosis not present

## 2024-01-11 DIAGNOSIS — Z23 Encounter for immunization: Secondary | ICD-10-CM

## 2024-01-11 DIAGNOSIS — O0990 Supervision of high risk pregnancy, unspecified, unspecified trimester: Secondary | ICD-10-CM

## 2024-01-11 DIAGNOSIS — Z8619 Personal history of other infectious and parasitic diseases: Secondary | ICD-10-CM
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
## 2024-01-11 NOTE — Progress Notes (Signed)
Pt presents ROB visit. No concerns.

## 2024-01-12 ENCOUNTER — Ambulatory Visit: Payer: Self-pay | Admitting: Advanced Practice Midwife

## 2024-01-12 DIAGNOSIS — O99019 Anemia complicating pregnancy, unspecified trimester: Secondary | ICD-10-CM

## 2024-01-12 LAB — CBC
Hematocrit: 29.4 % — ABNORMAL LOW (ref 34.0–46.6)
Hemoglobin: 9.1 g/dL — ABNORMAL LOW (ref 11.1–15.9)
MCH: 26.7 pg (ref 26.6–33.0)
MCHC: 31 g/dL — ABNORMAL LOW (ref 31.5–35.7)
MCV: 86 fL (ref 79–97)
Platelets: 205 x10E3/uL (ref 150–450)
RBC: 3.41 x10E6/uL — ABNORMAL LOW (ref 3.77–5.28)
RDW: 12.8 % (ref 11.7–15.4)
WBC: 8.3 x10E3/uL (ref 3.4–10.8)

## 2024-01-12 LAB — HIV ANTIBODY (ROUTINE TESTING W REFLEX): HIV Screen 4th Generation wRfx: NONREACTIVE

## 2024-01-12 LAB — GLUCOSE TOLERANCE, 2 HOURS W/ 1HR
Glucose, 1 hour: 96 mg/dL (ref 70–179)
Glucose, 2 hour: 87 mg/dL (ref 70–152)
Glucose, Fasting: 72 mg/dL (ref 70–91)

## 2024-01-12 LAB — RPR: RPR Ser Ql: NONREACTIVE

## 2024-01-12 MED ORDER — ACCRUFER 30 MG PO CAPS: 1.0000 | ORAL_CAPSULE | Freq: Every day | ORAL | 3 refills | Status: DC

## 2024-01-29 ENCOUNTER — Encounter: Admitting: Physician Assistant
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
## 2024-02-17 ENCOUNTER — Encounter: Admitting: Physician Assistant

## 2024-03-02 ENCOUNTER — Ambulatory Visit (INDEPENDENT_AMBULATORY_CARE_PROVIDER_SITE_OTHER): Admitting: Student

## 2024-03-02 VITALS — BP 120/80 | HR 78 | Wt 176.5 lb

## 2024-03-02 DIAGNOSIS — O09299 Supervision of pregnancy with other poor reproductive or obstetric history, unspecified trimester: Secondary | ICD-10-CM

## 2024-03-02 DIAGNOSIS — O99019 Anemia complicating pregnancy, unspecified trimester: Secondary | ICD-10-CM

## 2024-03-02 DIAGNOSIS — Z3A35 35 weeks gestation of pregnancy: Secondary | ICD-10-CM | POA: Diagnosis not present

## 2024-03-02 DIAGNOSIS — O0990 Supervision of high risk pregnancy, unspecified, unspecified trimester: Secondary | ICD-10-CM | POA: Diagnosis not present

## 2024-03-02 DIAGNOSIS — Z8759 Personal history of other complications of pregnancy, childbirth and the puerperium: Secondary | ICD-10-CM | POA: Diagnosis not present

## 2024-03-02 DIAGNOSIS — Z8619 Personal history of other infectious and parasitic diseases: Secondary | ICD-10-CM
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
## 2024-03-02 NOTE — Progress Notes (Signed)
 Pt presents for ROB. Monitoring BP at home. No questions or concerns.

## 2024-03-15 ENCOUNTER — Encounter: Admitting: Obstetrics and Gynecology

## 2024-03-24 ENCOUNTER — Encounter: Payer: Self-pay | Admitting: Obstetrics and Gynecology

## 2024-03-24 ENCOUNTER — Other Ambulatory Visit (HOSPITAL_COMMUNITY)
Admission: RE | Admit: 2024-03-24 | Discharge: 2024-03-24 | Disposition: A | Discharge status: Home / Self Care | Source: Ambulatory Visit | Attending: Obstetrics and Gynecology | Admitting: Obstetrics and Gynecology

## 2024-03-24 ENCOUNTER — Ambulatory Visit (INDEPENDENT_AMBULATORY_CARE_PROVIDER_SITE_OTHER): Admitting: Obstetrics and Gynecology

## 2024-03-24 VITALS — BP 126/82 | HR 73 | Wt 180.4 lb

## 2024-03-24 DIAGNOSIS — O09299 Supervision of pregnancy with other poor reproductive or obstetric history, unspecified trimester: Secondary | ICD-10-CM | POA: Diagnosis not present

## 2024-03-24 DIAGNOSIS — Z3A38 38 weeks gestation of pregnancy: Secondary | ICD-10-CM | POA: Insufficient documentation

## 2024-03-24 DIAGNOSIS — O0990 Supervision of high risk pregnancy, unspecified, unspecified trimester: Secondary | ICD-10-CM

## 2024-03-24 DIAGNOSIS — O99013 Anemia complicating pregnancy, third trimester: Secondary | ICD-10-CM

## 2024-03-24 DIAGNOSIS — D563 Thalassemia minor: Secondary | ICD-10-CM
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
## 2024-03-24 NOTE — Progress Notes (Signed)
 Pt presents for ROB visit. Pt c/o pressure and Braxton Hicks. No other concerns.

## 2024-03-25 LAB — CERVICOVAGINAL ANCILLARY ONLY
Chlamydia: POSITIVE — AB
Comment: NEGATIVE
Comment: NORMAL
Neisseria Gonorrhea: NEGATIVE

## 2024-03-25 LAB — CBC
Hematocrit: 28.3 % — ABNORMAL LOW (ref 34.0–46.6)
Hemoglobin: 8.6 g/dL — ABNORMAL LOW (ref 11.1–15.9)
MCH: 24.2 pg — ABNORMAL LOW (ref 26.6–33.0)
MCHC: 30.4 g/dL — ABNORMAL LOW (ref 31.5–35.7)
MCV: 80 fL (ref 79–97)
Platelets: 194 x10E3/uL (ref 150–450)
RBC: 3.56 x10E6/uL — ABNORMAL LOW (ref 3.77–5.28)
RDW: 15.6 % — ABNORMAL HIGH (ref 11.7–15.4)
WBC: 7.4 x10E3/uL (ref 3.4–10.8)

## 2024-03-27 LAB — CULTURE, BETA STREP (GROUP B ONLY): Strep Gp B Culture: POSITIVE — AB

## 2024-03-31 ENCOUNTER — Ambulatory Visit (INDEPENDENT_AMBULATORY_CARE_PROVIDER_SITE_OTHER): Admitting: Obstetrics & Gynecology

## 2024-03-31 VITALS — BP 126/76 | HR 85 | Wt 183.0 lb

## 2024-03-31 DIAGNOSIS — Z3A39 39 weeks gestation of pregnancy: Secondary | ICD-10-CM

## 2024-03-31 DIAGNOSIS — O0990 Supervision of high risk pregnancy, unspecified, unspecified trimester: Secondary | ICD-10-CM

## 2024-03-31 DIAGNOSIS — Z8619 Personal history of other infectious and parasitic diseases: Secondary | ICD-10-CM | POA: Diagnosis not present
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
## 2024-04-01 ENCOUNTER — Inpatient Hospital Stay (HOSPITAL_COMMUNITY): Admitting: Anesthesiology

## 2024-04-01 ENCOUNTER — Encounter (HOSPITAL_COMMUNITY): Payer: Self-pay | Admitting: Obstetrics and Gynecology

## 2024-04-01 ENCOUNTER — Other Ambulatory Visit: Payer: Self-pay

## 2024-04-01 ENCOUNTER — Inpatient Hospital Stay (HOSPITAL_COMMUNITY)
Admission: AD | Admit: 2024-04-01 | Discharge: 2024-04-04 | DRG: 805 | Disposition: A | Discharge status: Home / Self Care | Attending: Obstetrics & Gynecology | Admitting: Obstetrics & Gynecology

## 2024-04-01 DIAGNOSIS — Z148 Genetic carrier of other disease: Secondary | ICD-10-CM | POA: Diagnosis not present

## 2024-04-01 DIAGNOSIS — D62 Acute posthemorrhagic anemia: Secondary | ICD-10-CM | POA: Diagnosis not present

## 2024-04-01 DIAGNOSIS — O26893 Other specified pregnancy related conditions, third trimester: Secondary | ICD-10-CM | POA: Diagnosis present

## 2024-04-01 DIAGNOSIS — O411230 Chorioamnionitis, third trimester, not applicable or unspecified: Secondary | ICD-10-CM | POA: Diagnosis present

## 2024-04-01 DIAGNOSIS — O99824 Streptococcus B carrier state complicating childbirth: Secondary | ICD-10-CM | POA: Diagnosis present

## 2024-04-01 DIAGNOSIS — E66813 Obesity, class 3: Secondary | ICD-10-CM | POA: Diagnosis present

## 2024-04-01 DIAGNOSIS — Z8249 Family history of ischemic heart disease and other diseases of the circulatory system: Secondary | ICD-10-CM

## 2024-04-01 DIAGNOSIS — O0990 Supervision of high risk pregnancy, unspecified, unspecified trimester: Principal | ICD-10-CM

## 2024-04-01 DIAGNOSIS — O99019 Anemia complicating pregnancy, unspecified trimester: Secondary | ICD-10-CM | POA: Insufficient documentation

## 2024-04-01 DIAGNOSIS — Z3A39 39 weeks gestation of pregnancy: Secondary | ICD-10-CM

## 2024-04-01 DIAGNOSIS — Z833 Family history of diabetes mellitus: Secondary | ICD-10-CM | POA: Diagnosis not present

## 2024-04-01 DIAGNOSIS — O480 Post-term pregnancy: Secondary | ICD-10-CM | POA: Diagnosis not present

## 2024-04-01 DIAGNOSIS — O9081 Anemia of the puerperium: Secondary | ICD-10-CM | POA: Diagnosis not present

## 2024-04-01 DIAGNOSIS — Z87891 Personal history of nicotine dependence: Secondary | ICD-10-CM

## 2024-04-01 DIAGNOSIS — Z3A40 40 weeks gestation of pregnancy: Secondary | ICD-10-CM | POA: Diagnosis not present

## 2024-04-01 DIAGNOSIS — Z7982 Long term (current) use of aspirin: Secondary | ICD-10-CM | POA: Diagnosis not present

## 2024-04-01 DIAGNOSIS — O99214 Obesity complicating childbirth: Secondary | ICD-10-CM | POA: Diagnosis present

## 2024-04-01 DIAGNOSIS — O135 Gestational [pregnancy-induced] hypertension without significant proteinuria, complicating the puerperium: Secondary | ICD-10-CM | POA: Diagnosis not present

## 2024-04-01 DIAGNOSIS — O99820 Streptococcus B carrier state complicating pregnancy: Secondary | ICD-10-CM | POA: Diagnosis not present

## 2024-04-01 DIAGNOSIS — O134 Gestational [pregnancy-induced] hypertension without significant proteinuria, complicating childbirth: Secondary | ICD-10-CM | POA: Diagnosis present

## 2024-04-01 DIAGNOSIS — O411290 Chorioamnionitis, unspecified trimester, not applicable or unspecified: Secondary | ICD-10-CM | POA: Diagnosis present

## 2024-04-01 HISTORY — DX: Chlamydial infection, unspecified: A74.9

## 2024-04-01 HISTORY — DX: Unspecified viral hepatitis C without hepatic coma: B19.20

## 2024-04-01 LAB — CBC
HCT: 29.3 % — ABNORMAL LOW (ref 36.0–46.0)
Hemoglobin: 9 g/dL — ABNORMAL LOW (ref 12.0–15.0)
MCH: 23.6 pg — ABNORMAL LOW (ref 26.0–34.0)
MCHC: 30.7 g/dL (ref 30.0–36.0)
MCV: 76.7 fL — ABNORMAL LOW (ref 80.0–100.0)
Platelets: 160 K/uL (ref 150–400)
RBC: 3.82 MIL/uL — ABNORMAL LOW (ref 3.87–5.11)
RDW: 16.2 % — ABNORMAL HIGH (ref 11.5–15.5)
WBC: 9.4 K/uL (ref 4.0–10.5)
nRBC: 0 % (ref 0.0–0.2)

## 2024-04-01 LAB — COMPREHENSIVE METABOLIC PANEL WITH GFR
ALT: 11 U/L (ref 0–44)
AST: 23 U/L (ref 15–41)
Albumin: 2.7 g/dL — ABNORMAL LOW (ref 3.5–5.0)
Alkaline Phosphatase: 124 U/L (ref 38–126)
Anion gap: 10 (ref 5–15)
BUN: 5 mg/dL — ABNORMAL LOW (ref 6–20)
CO2: 18 mmol/L — ABNORMAL LOW (ref 22–32)
Calcium: 8.4 mg/dL — ABNORMAL LOW (ref 8.9–10.3)
Chloride: 109 mmol/L (ref 98–111)
Creatinine, Ser: 0.56 mg/dL (ref 0.44–1.00)
GFR, Estimated: >60 mL/min (ref >60)
Glucose, Bld: 85 mg/dL (ref 70–99)
Potassium: 3.8 mmol/L (ref 3.5–5.1)
Sodium: 137 mmol/L (ref 135–145)
Total Bilirubin: 0.7 mg/dL (ref 0.0–1.2)
Total Protein: 6.6 g/dL (ref 6.5–8.1)

## 2024-04-01 LAB — POCT FERN TEST: POCT Fern Test: POSITIVE

## 2024-04-01 LAB — TYPE AND SCREEN
ABO/RH(D): A POS
Antibody Screen: NEGATIVE

## 2024-04-01 LAB — PROTEIN / CREATININE RATIO, URINE
Creatinine, Urine: 178 mg/dL
Protein Creatinine Ratio: 0.15 mg/mg{creat} (ref 0.00–0.15)
Total Protein, Urine: 27 mg/dL

## 2024-04-01 MED ORDER — OXYTOCIN BOLUS FROM INFUSION
333.0000 mL | Freq: Once | INTRAVENOUS | Status: AC
  Administered 2024-04-02: 333 mL via INTRAVENOUS

## 2024-04-01 MED ORDER — LACTATED RINGERS IV SOLN: INTRAVENOUS | Status: DC

## 2024-04-01 MED ORDER — EPHEDRINE 5 MG/ML INJ: 10.0000 mg | INTRAVENOUS | Status: DC | PRN

## 2024-04-01 MED ORDER — LACTATED RINGERS IV SOLN
500.0000 mL | Freq: Once | INTRAVENOUS | Status: AC
  Administered 2024-04-01: 500 mL via INTRAVENOUS

## 2024-04-01 MED ORDER — OXYTOCIN-SODIUM CHLORIDE 30-0.9 UT/500ML-% IV SOLN
2.5000 [IU]/h | INTRAVENOUS | Status: DC
  Administered 2024-04-02: 2.5 [IU]/h via INTRAVENOUS
  Filled 2024-04-01: qty 500

## 2024-04-01 MED ORDER — OXYCODONE-ACETAMINOPHEN 5-325 MG PO TABS: 2.0000 | ORAL_TABLET | ORAL | Status: DC | PRN

## 2024-04-01 MED ORDER — LACTATED RINGERS IV SOLN: 500.0000 mL | INTRAVENOUS | Status: DC | PRN

## 2024-04-01 MED ORDER — PENICILLIN G POT IN DEXTROSE 60000 UNIT/ML IV SOLN
3.0000 10*6.[IU] | INTRAVENOUS | Status: DC
  Administered 2024-04-01 (×2): 3 10*6.[IU] via INTRAVENOUS
  Filled 2024-04-01 (×2): qty 50

## 2024-04-01 MED ORDER — ONDANSETRON HCL 4 MG/2ML IJ SOLN
4.0000 mg | Freq: Four times a day (QID) | INTRAMUSCULAR | Status: DC | PRN
  Administered 2024-04-01 (×2): 4 mg via INTRAVENOUS
  Filled 2024-04-01 (×2): qty 2

## 2024-04-01 MED ORDER — DIPHENHYDRAMINE HCL 50 MG/ML IJ SOLN: 12.5000 mg | INTRAMUSCULAR | Status: DC | PRN

## 2024-04-01 MED ORDER — TERBUTALINE SULFATE 1 MG/ML IJ SOLN
INTRAMUSCULAR | Status: AC
  Filled 2024-04-01: qty 1

## 2024-04-01 MED ORDER — SOD CITRATE-CITRIC ACID 500-334 MG/5ML PO SOLN
30.0000 mL | ORAL | Status: DC | PRN
Start: 2024-04-01 — End: 2024-04-02

## 2024-04-01 MED ORDER — TERBUTALINE SULFATE 1 MG/ML IJ SOLN
0.2500 mg | INTRAMUSCULAR | Status: DC | PRN
  Administered 2024-04-01: 0.25 mg via SUBCUTANEOUS

## 2024-04-01 MED ORDER — PHENYLEPHRINE 80 MCG/ML (10ML) SYRINGE FOR IV PUSH (FOR BLOOD PRESSURE SUPPORT): 80.0000 ug | PREFILLED_SYRINGE | INTRAVENOUS | Status: DC | PRN

## 2024-04-01 MED ORDER — SODIUM CHLORIDE 0.9 % IV SOLN
5.0000 10*6.[IU] | Freq: Once | INTRAVENOUS | Status: AC
  Administered 2024-04-01: 5 10*6.[IU] via INTRAVENOUS
  Filled 2024-04-01: qty 5

## 2024-04-01 MED ORDER — LIDOCAINE HCL (PF) 1 % IJ SOLN
INTRAMUSCULAR | Status: DC | PRN
  Administered 2024-04-01: 11 mL via EPIDURAL

## 2024-04-01 MED ORDER — ACETAMINOPHEN 325 MG PO TABS
650.0000 mg | ORAL_TABLET | ORAL | Status: DC | PRN
Start: 2024-04-01 — End: 2024-04-02
  Administered 2024-04-01: 650 mg via ORAL
  Filled 2024-04-01: qty 2

## 2024-04-01 MED ORDER — LIDOCAINE HCL (PF) 1 % IJ SOLN: 30.0000 mL | INTRAMUSCULAR | Status: DC | PRN

## 2024-04-01 MED ORDER — LACTATED RINGERS AMNIOINFUSION
INTRAVENOUS | Status: DC
Start: 2024-04-01 — End: 2024-04-02
  Filled 2024-04-01 (×2): qty 1000

## 2024-04-01 MED ORDER — FENTANYL-BUPIVACAINE-NACL 0.5-0.125-0.9 MG/250ML-% EP SOLN
12.0000 mL/h | EPIDURAL | Status: DC | PRN
  Administered 2024-04-01 – 2024-04-02 (×2): 12 mL/h via EPIDURAL
  Filled 2024-04-01 (×2): qty 250

## 2024-04-01 MED ORDER — OXYCODONE-ACETAMINOPHEN 5-325 MG PO TABS: 1.0000 | ORAL_TABLET | ORAL | Status: DC | PRN
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
## 2024-04-01 NOTE — Progress Notes (Signed)
 Labor Progress Note Sarah Luna is a 23 y.o. G3P0101 at [redacted]w[redacted]d presenting for SOOL and new gHTN  S: Called to room by RN for fetal bradycardia. episode of fetal bradycardia down to 60s. FSE & IUPC discussed and patient verbally consented before placement. Amnioinfusion started. Position changes initiated. FHT continued to have deep variable decelerations with contractions. Terbutaline given, discussed with patient possibly need for c-section if fetal intolerance of contractions. Shortly thereafter pt moved into high fowlers position, which fetus tolerated well.  O:  BP 118/75   Pulse 76   Temp 98.1 F (36.7 C) (Oral)   Resp 18   Ht 5' 1 (1.549 m)   Wt 83 kg   LMP 06/27/2023 (Exact Date)   SpO2 100%   BMI 34.58 kg/m  Lab Results  Component Value Date   HGB 9.0 (L) 04/01/2024    Time: 4:01 PM  FHT: baseline bpm 120, moderate variability, accelerations present, decelerations variable, early in timing.  Contractions: q every 1 min    CVE: Dilation: 7 Effacement (%): 90 Station: -2 Presentation: Vertex Exam by:: Trudy, MD   A&P: 23 y.o. G3P0101 [redacted]w[redacted]d SOOL and new gHTN #Labor: Active Labor none IUPC with amnioinfusion and FSE in place #Pain: Epidural #FWB: Category II #GBS positive #gHTN: upon admission--hx of severe pre-E with prior pregnancy. PEC labs normal  Leeroy [name], MD 4:01 PM

## 2024-04-01 NOTE — Anesthesia Procedure Notes (Signed)
 Epidural Patient location during procedure: OB Start time: 04/01/2024 2:02 PM End time: 04/01/2024 2:16 PM  Staffing Anesthesiologist: Cleotilde Butler Dade, MD Performed: anesthesiologist   Preanesthetic Checklist Completed: patient identified, IV checked, site marked, risks and benefits discussed, surgical consent, monitors and equipment checked, pre-op evaluation and timeout performed  Epidural Patient position: sitting Prep: ChloraPrep Patient monitoring: heart rate, cardiac monitor, continuous pulse ox and blood pressure Approach: midline Location: L2-L3 Injection technique: LOR saline  Needle:  Needle type: Tuohy  Needle gauge: 17 G Needle length: 9 cm Needle insertion depth: 7 cm Catheter type: closed end flexible Catheter size: 20 Guage Catheter at skin depth: 11 cm Test dose: negative  Assessment Events: blood not aspirated, injection not painful, no injection resistance, no paresthesia and negative IV test  Additional Notes Reason for block:procedure for pain

## 2024-04-01 NOTE — Anesthesia Preprocedure Evaluation (Addendum)
 Anesthesia Evaluation  Patient identified by MRN, date of birth, ID band Patient awake    Reviewed: Allergy & Precautions, NPO status , Patient's Chart, lab work & pertinent test results  History of Anesthesia Complications Negative for: history of anesthetic complications  Airway Mallampati: II  TM Distance: >3 FB Neck ROM: Full    Dental  (+) Dental Advisory Given   Pulmonary former smoker   breath sounds clear to auscultation       Cardiovascular hypertension,  Rhythm:Regular Rate:Normal     Neuro/Psych negative neurological ROS     GI/Hepatic negative GI ROS, Neg liver ROS,,,  Endo/Other    Class 3 obesity  Renal/GU negative Renal ROS     Musculoskeletal   Abdominal  (+) + obese  Peds  Hematology Hb 9.0, plt 191k   Anesthesia Other Findings   Reproductive/Obstetrics (+) Pregnancy (no pre-natal care, pre-term)                              Anesthesia Physical Anesthesia Plan  ASA: III  Anesthesia Plan: Epidural   Post-op Pain Management:    Induction:   PONV Risk Score and Plan: Treatment may vary due to age or medical condition  Airway Management Planned: Natural Airway  Additional Equipment:   Intra-op Plan:   Post-operative Plan:   Informed Consent: I have reviewed the patients History and Physical, chart, labs and discussed the procedure including the risks, benefits and alternatives for the proposed anesthesia with the patient or authorized representative who has indicated his/her understanding and acceptance.     Dental advisory given  Plan Discussed with:   Anesthesia Plan Comments: (Patient identified. Risks/Benefits/Options discussed with patient including but not limited to bleeding, infection, nerve damage, paralysis, failed block, incomplete pain control, headache, blood pressure changes, nausea, vomiting, reactions to medication both or allergic, itching  and postpartum back pain. Confirmed with bedside nurse the patient's most recent platelet count. Confirmed with patient that they are not currently taking any anticoagulation, have any bleeding history or any family history of bleeding disorders. Patient expressed understanding and wished to proceed. All questions were answered. )         Anesthesia Quick Evaluation

## 2024-04-01 NOTE — MAU Note (Signed)
 Sarah Luna is a 23 y.o. at [redacted]w[redacted]d here in MAU reporting: ctx since 5am. Now about 4-5 min aprt Denies any leaking. Reports some bloody mucus. Good fetal movement felt. 3cm yesterday in office.   LMP:  Onset of complaint: 5am Pain score: 5 Vitals:   04/01/24 1148  BP: 134/82  Pulse: 67  Resp: 18  Temp: 98.7 F (37.1 C)     FHT: 137  Lab orders placed from triage: labor eval

## 2024-04-02 ENCOUNTER — Encounter (HOSPITAL_COMMUNITY): Payer: Self-pay | Admitting: Obstetrics and Gynecology

## 2024-04-02 DIAGNOSIS — O411230 Chorioamnionitis, third trimester, not applicable or unspecified: Secondary | ICD-10-CM

## 2024-04-02 DIAGNOSIS — Z3A40 40 weeks gestation of pregnancy: Secondary | ICD-10-CM

## 2024-04-02 DIAGNOSIS — O99820 Streptococcus B carrier state complicating pregnancy: Secondary | ICD-10-CM

## 2024-04-02 DIAGNOSIS — O411290 Chorioamnionitis, unspecified trimester, not applicable or unspecified: Secondary | ICD-10-CM | POA: Diagnosis present

## 2024-04-02 DIAGNOSIS — O480 Post-term pregnancy: Secondary | ICD-10-CM

## 2024-04-02 DIAGNOSIS — O135 Gestational [pregnancy-induced] hypertension without significant proteinuria, complicating the puerperium: Secondary | ICD-10-CM

## 2024-04-02 HISTORY — DX: Chorioamnionitis, unspecified trimester, not applicable or unspecified: O41.1290

## 2024-04-02 LAB — RPR: RPR Ser Ql: NONREACTIVE

## 2024-04-02 MED ORDER — AZITHROMYCIN 250 MG PO TABS
1000.0000 mg | ORAL_TABLET | Freq: Once | ORAL | Status: AC
  Administered 2024-04-02: 1000 mg via ORAL
  Filled 2024-04-02: qty 4

## 2024-04-02 MED ORDER — COCONUT OIL OIL: 1.0000 | TOPICAL_OIL | Status: DC | PRN

## 2024-04-02 MED ORDER — AMPICILLIN SODIUM 2 G IJ SOLR
2.0000 g | Freq: Four times a day (QID) | INTRAMUSCULAR | Status: DC
  Administered 2024-04-02 (×3): 2 g via INTRAVENOUS
  Filled 2024-04-02 (×3): qty 2000

## 2024-04-02 MED ORDER — BENZOCAINE-MENTHOL 20-0.5 % EX AERO: 1.0000 | INHALATION_SPRAY | CUTANEOUS | Status: DC | PRN

## 2024-04-02 MED ORDER — SENNOSIDES-DOCUSATE SODIUM 8.6-50 MG PO TABS
2.0000 | ORAL_TABLET | Freq: Every day | ORAL | Status: DC
  Administered 2024-04-03 – 2024-04-04 (×2): 2 via ORAL
  Filled 2024-04-02 (×2): qty 2

## 2024-04-02 MED ORDER — PRENATAL MULTIVITAMIN CH
1.0000 | ORAL_TABLET | Freq: Every day | ORAL | Status: DC
  Administered 2024-04-03: 1 via ORAL
  Filled 2024-04-02: qty 1

## 2024-04-02 MED ORDER — ZOLPIDEM TARTRATE 5 MG PO TABS: 5.0000 mg | ORAL_TABLET | Freq: Every evening | ORAL | Status: DC | PRN

## 2024-04-02 MED ORDER — TETANUS-DIPHTH-ACELL PERTUSSIS 5-2-15.5 LF-MCG/0.5 IM SUSP: 0.5000 mL | Freq: Once | INTRAMUSCULAR | Status: DC

## 2024-04-02 MED ORDER — ONDANSETRON HCL 4 MG/2ML IJ SOLN: 4.0000 mg | INTRAMUSCULAR | Status: DC | PRN

## 2024-04-02 MED ORDER — DIPHENHYDRAMINE HCL 25 MG PO CAPS: 25.0000 mg | ORAL_CAPSULE | Freq: Four times a day (QID) | ORAL | Status: DC | PRN

## 2024-04-02 MED ORDER — SIMETHICONE 80 MG PO CHEW: 80.0000 mg | CHEWABLE_TABLET | ORAL | Status: DC | PRN

## 2024-04-02 MED ORDER — WITCH HAZEL-GLYCERIN EX PADS: 1.0000 | MEDICATED_PAD | CUTANEOUS | Status: DC | PRN

## 2024-04-02 MED ORDER — DIPHENHYDRAMINE HCL 50 MG/ML IJ SOLN
25.0000 mg | Freq: Once | INTRAMUSCULAR | Status: AC
  Administered 2024-04-02: 25 mg via INTRAVENOUS
  Filled 2024-04-02: qty 1

## 2024-04-02 MED ORDER — DIBUCAINE (PERIANAL) 1 % EX OINT: 1.0000 | TOPICAL_OINTMENT | CUTANEOUS | Status: DC | PRN

## 2024-04-02 MED ORDER — ACETAMINOPHEN 325 MG PO TABS: 650.0000 mg | ORAL_TABLET | ORAL | Status: DC | PRN

## 2024-04-02 MED ORDER — ONDANSETRON HCL 4 MG PO TABS
4.0000 mg | ORAL_TABLET | ORAL | Status: DC | PRN
  Administered 2024-04-02: 4 mg via ORAL
  Filled 2024-04-02: qty 1

## 2024-04-02 MED ORDER — GENTAMICIN SULFATE 40 MG/ML IJ SOLN
5.0000 mg/kg | INTRAVENOUS | Status: AC
  Administered 2024-04-02: 310 mg via INTRAVENOUS
  Filled 2024-04-02: qty 7.75

## 2024-04-02 MED ORDER — IBUPROFEN 600 MG PO TABS
600.0000 mg | ORAL_TABLET | Freq: Four times a day (QID) | ORAL | Status: DC
  Administered 2024-04-02 – 2024-04-04 (×6): 600 mg via ORAL
  Filled 2024-04-02 (×6): qty 1

## 2024-04-02 NOTE — Progress Notes (Signed)
 Labor Progress Note Sarah Luna is a 23 y.o. G3P0101 at [redacted]w[redacted]d presented for SOL  S:  Comfortable with epidural, no concerns at this time  O:  BP 123/84   Pulse 95   Temp (!) 100.6 F (38.1 C) (Oral)   Resp 17   Ht 5' 1 (1.549 m)   Wt 83 kg   LMP 06/27/2023 (Exact Date)   SpO2 100%   BMI 34.58 kg/m  EFM: baseline 130 bpm/ mod variability/ + accels/ recurrent variable decels with good recovery Toco/IUPC: q2-68min SVE: Dilation: 8 Effacement (%): 70 Station: -1 Presentation: Vertex Exam by:: [name] MD  A/P: 23 y.o. G3P0101 [redacted]w[redacted]d  1. Labor: IUPC in place, continues with adequate ctx without pitocin , cervical swelling noted, will give benadryl  and re-examine in an hour 2. Pain: Epidural in place 3. GBS positive, PCN given 4. Fever: Tmax 100.6, tried giving tylenol  but patient immediately vomited, will give ampicillin & gentamicin for chorioamnionitis  - Anticipate SVD.  Charlie DELENA Courts, MD 12:56 AM

## 2024-04-02 NOTE — Discharge Summary (Signed)
 Postpartum Discharge Summary  Date of Service updated***     Patient Name: Sarah Luna DOB: 08-Sep-2000 MRN: [id number]  Date of admission: 04/01/2024 Delivery date:04/02/2024 Delivering provider: LEFTWICH-KIRBY, OLAM A Date of discharge: 04/02/2024  Admitting diagnosis: Normal labor [O80, Z37.9] Intrauterine pregnancy: [redacted]w[redacted]d     Secondary diagnosis:  Principal Problem:   Normal labor Active Problems:   Chorioamnionitis  Additional problems: ***    Discharge diagnosis: {DX.:23714}                                              Post partum procedures:{Postpartum procedures:23558} Augmentation: {Augmentation:20782} Complications: {OB Labor/Delivery Complications:20784}  Hospital course: {Courses:23701}  Magnesium  Sulfate received: {Mag received:30440022} BMZ received: {BMZ received:30440023} Rhophylac:{Rhophylac received:30440032} FFM:{FFM:69559966} T-DaP:{Tdap:23962} Flu: {Qol:76036} RSV Vaccine received: {RSV:31013} Transfusion:{Transfusion received:30440034}  Immunizations received: Immunization History  Administered Date(s) Administered   DTaP 02/21/2000, 04/20/2000, 06/19/2000, 12/09/2001, 12/20/2004   HIB, Unspecified 02/21/2000, 04/20/2000, 06/19/2000, 03/23/2001   HPV 9-valent 06/26/2016, 01/07/2017   HPV Quadrivalent 02/23/2014   Hepatitis A, Ped/Adol-2 Dose 02/23/2014, 06/26/2016   Hepatitis B, PED/ADOLESCENT 02/21/2000, 04/20/2000, 09/28/2000   IPV 02/21/2000, 04/20/2000, 12/09/2001, 12/20/2004   MMR 03/23/2001, 12/20/2004   Meningococcal Acwy, Unspecified 01/02/2011   Meningococcal Conjugate 06/26/2016   Pneumococcal Conjugate PCV 7 02/21/2000, 04/20/2000, 06/19/2000, 10/08/2000   Tdap 01/02/2011, 11/09/2017, 01/11/2024   Varicella 12/23/2000, 02/23/2014    Physical exam  Vitals:   04/02/24 1153 04/02/24 1202 04/02/24 1217 04/02/24 1232  BP: 132/85 132/64 117/74 120/78  Pulse: 88 96 83 80  Resp:      Temp:      TempSrc:      SpO2:       Weight:      Height:       General: {Exam; general:21111117} Lochia: {Desc; appropriate/inappropriate:30686::appropriate} Uterine Fundus: {Desc; firm/soft:30687} Incision: {Exam; incision:21111123} DVT Evaluation: {Exam; dvt:2111122} Labs: Lab Results  Component Value Date   WBC 9.4 04/01/2024   HGB 9.0 (L) 04/01/2024   HCT 29.3 (L) 04/01/2024   MCV 76.7 (L) 04/01/2024   PLT 160 04/01/2024      Latest Ref Rng & Units 04/01/2024   12:45 PM  CMP  Glucose 70 - 99 mg/dL 85   BUN 6 - 20 mg/dL 5   Creatinine 9.55 - 8.99 mg/dL 9.43   Sodium 864 - 854 mmol/L 137   Potassium 3.5 - 5.1 mmol/L 3.8   Chloride 98 - 111 mmol/L 109   CO2 22 - 32 mmol/L 18   Calcium 8.9 - 10.3 mg/dL 8.4   Total Protein 6.5 - 8.1 g/dL 6.6   Total Bilirubin 0.0 - 1.2 mg/dL 0.7   Alkaline Phos 38 - 126 U/L 124   AST 15 - 41 U/L 23   ALT 0 - 44 U/L 11    Edinburgh Score:    11/09/2017    7:50 PM  Edinburgh Postnatal Depression Scale Screening Tool  I have been able to laugh and see the funny side of things. 0  I have looked forward with enjoyment to things. 0  I have blamed myself unnecessarily when things went wrong. 0  I have been anxious or worried for no good reason. 0  I have felt scared or panicky for no good reason. 0  Things have been getting on top of me. 1  I have been so unhappy that I have had difficulty  sleeping. 0  I have felt sad or miserable. 0  I have been so unhappy that I have been crying. 0  The thought of harming myself has occurred to me. 0  Edinburgh Postnatal Depression Scale Total 1      Data saved with a previous flowsheet row definition   No data recorded  After visit meds:  Allergies as of 04/02/2024   No Known Allergies   Med Rec must be completed prior to using this [facility]***        Discharge home in stable condition Infant Feeding: {Baby feeding:23562} Infant Disposition:{CHL IP OB HOME WITH FNUYZM:76418} Discharge instruction: per After Visit  Summary and Postpartum booklet. Activity: Advance as tolerated. Pelvic rest for 6 weeks.  Diet: {OB ipzu:78888878} Future Appointments:No future appointments. Follow up Visit:  Message sent to [facility] on 04/02/24:  Please schedule this patient for a In person postpartum visit in 6 weeks with the following provider: Any provider. Additional Postpartum F/U:BP check 1 week  Low risk pregnancy complicated by: HTN intrapartum Delivery mode:  Vaginal, Spontaneous Anticipated Birth Control:  POPs   04/02/2024 Olam Boards, CNM

## 2024-04-02 NOTE — Progress Notes (Signed)
 Labor Progress Note Sarah Luna is a 23 y.o. G3P0101 at [redacted]w[redacted]d presented for SOL  S:  Comfortable with epidural  O:  BP (!) 131/90   Pulse 84   Temp 98.3 F (36.8 C) (Oral)   Resp 17   Ht 5' 1 (1.549 m)   Wt 83 kg   LMP 06/27/2023 (Exact Date)   SpO2 100%   BMI 34.58 kg/m  EFM: baseline 120 bpm/ mod variability/ + accels/ recurrent late/variable decels  Toco/IUPC: ctx q4-73min SVE: Dilation: 8.5 Effacement (%): 80 Station: -1 Presentation: Vertex Exam by:: Dr. Jomarie  A/P: 23 y.o. G3P0101 [redacted]w[redacted]d  1. Labor: MVUs remain adequate without augmentation; Continues to have swollen anterior lip, some improvement after benadryl  and ice, but no continued improvement with second round of ice. Discussed increasing need for C-section due to fetal intolerance, patient expressed understanding 2. Pain: Epidural 3. GBS positive 4. Chorioamnionitis: on ampicillin and gentamicin   Charlie DELENA Jomarie, MD 5:07 AM

## 2024-04-02 NOTE — Consult Note (Signed)
 ANTIBIOTIC CONSULT NOTE - INITIAL  Pharmacy Consult for Gentamicin Indication: Chorioamnionitis   No Known Allergies  Patient Measurements: Height: 5' 1 (154.9 cm) Weight: 83 kg (183 lb) IBW/kg (Calculated) : 47.8 Adjusted Body Weight: 61.9  Vital Signs: Temp: 100.6 F (38.1 C) (10/10 2330) Temp Source: Oral (10/10 2330) BP: 126/87 (10/11 0000) Pulse Rate: 94 (10/11 0000)  Labs: Recent Labs    04/01/24 1245  WBC 9.4  HGB 9.0*  PLT 160  LABCREA 178  CREATININE 0.56   No results for input(s): GENTTROUGH, GENTPEAK, GENTRANDOM in the last 72 hours.   Microbiology: Recent Results (from the past 720 hours)  Culture, beta strep (group b only)     Status: Abnormal   Collection Time: 03/24/24 11:23 AM   Specimen: Vaginal/Rectal; Genital   VR  Result Value Ref Range Status   Strep Gp B Culture Positive (A) Negative Final    Comment: Centers for Disease Control and Prevention (CDC) and American Congress of Obstetricians and Gynecologists (ACOG) guidelines for prevention of perinatal group B streptococcal (GBS) disease specify co-collection of a vaginal and rectal swab specimen to maximize sensitivity of GBS detection. Per the CDC and ACOG, swabbing both the lower vagina and rectum substantially increases the yield of detection compared with sampling the vagina alone. Penicillin  G, ampicillin, or cefazolin are indicated for intrapartum prophylaxis of perinatal GBS colonization. Reflex susceptibility testing should be performed prior to use of clindamycin only on GBS isolates from penicillin -allergic women who are considered a high risk for anaphylaxis. Treatment with vancomycin without additional testing is warranted if resistance to clindamycin is noted.     Medications:  Ampicillin  Assessment: 23 y.o. female G3P0101 at [redacted]w[redacted]d at 7.5cm dilated  Goal of Therapy:  Gentamicin peak 6-8 mg/L and Trough < 1 mg/L  Plan:  Gentamicin 5 mg/kg IV x 1  Check Scr  with next labs if gentamicin continued. Will check gentamicin levels if continued > 72hr or clinically indicated.  Powell GORMAN Sick 04/02/2024,12:36 AM

## 2024-04-02 NOTE — Progress Notes (Signed)
 Sarah Luna is a 23 y.o. G3P0101 at [redacted]w[redacted]d by LMP admitted for SOL, now with new GHTN.  Subjective: Pt comfortable with epidural, family in room for support.   Objective: BP 103/80   Pulse 88   Temp 99.5 F (37.5 C) (Axillary)   Resp 17   Ht 5' 1 (1.549 m)   Wt 83 kg   LMP 06/27/2023 (Exact Date)   SpO2 100%   BMI 34.58 kg/m  I/O last 3 completed shifts: In: 112.5 [I.V.:112.5] Out: -  No intake/output data recorded.  FHT:  FHR: 135 bpm, variability: moderate,  accelerations:  Present,  decelerations:  Present early decels with ctx and prolonged x 1 after cervical exam. UC:   regular, every 3-4 minutes SVE:   Dilation: Lip/rim Effacement (%): 80 Station: -1 Exam by:: Olam Leftwich-kirby CNM No cervix posterior, small amount of swelling left, anterior cervix, fetal head advanced past cervix and cervix soft and stretchy despite edema.   Labs: Lab Results  Component Value Date   WBC 9.4 04/01/2024   HGB 9.0 (L) 04/01/2024   HCT 29.3 (L) 04/01/2024   MCV 76.7 (L) 04/01/2024   PLT 160 04/01/2024    Assessment / Plan: Spontaneous labor, progressing normally  Labor: Progressing normally Preeclampsia:  labs stable Fetal Wellbeing:  Category I Pain Control:  Epidural I/D:  GBS pos on PCN Anticipated MOD:  NSVD  Olam Boards, CNM 04/02/2024, 10:30 AM

## 2024-04-02 NOTE — Plan of Care (Signed)
  Problem: Education: Goal: Knowledge of General Education information will improve Description: Including pain rating scale, medication(s)/side effects and non-pharmacologic comfort measures Outcome: Progressing   Problem: Health Behavior/Discharge Planning: Goal: Ability to manage health-related needs will improve Outcome: Progressing   Problem: Clinical Measurements: Goal: Ability to maintain clinical measurements within normal limits will improve Outcome: Progressing Goal: Will remain free from infection Outcome: Progressing Goal: Diagnostic test results will improve Outcome: Progressing Goal: Respiratory complications will improve Outcome: Progressing Goal: Cardiovascular complication will be avoided Outcome: Progressing   Problem: Activity: Goal: Risk for activity intolerance will decrease Outcome: Progressing   Problem: Nutrition: Goal: Adequate nutrition will be maintained Outcome: Progressing   Problem: Coping: Goal: Level of anxiety will decrease Outcome: Progressing   Problem: Elimination: Goal: Will not experience complications related to bowel motility Outcome: Progressing Goal: Will not experience complications related to urinary retention Outcome: Progressing   Problem: Pain Managment: Goal: General experience of comfort will improve and/or be controlled Outcome: Progressing   Problem: Safety: Goal: Ability to remain free from injury will improve Outcome: Progressing   Problem: Skin Integrity: Goal: Risk for impaired skin integrity will decrease Outcome: Progressing   Problem: Pain Management: Goal: Relief or control of pain from uterine contractions will improve Outcome: Progressing   Problem: Education: Goal: Knowledge of condition will improve Outcome: Progressing Goal: Individualized Educational Video(s) Outcome: Progressing Goal: Individualized Newborn Educational Video(s) Outcome: Progressing   Problem: Activity: Goal: Will  verbalize the importance of balancing activity with adequate rest periods Outcome: Progressing Goal: Ability to tolerate increased activity will improve Outcome: Progressing   Problem: Coping: Goal: Ability to identify and utilize available resources and services will improve Outcome: Progressing   Problem: Life Cycle: Goal: Chance of risk for complications during the postpartum period will decrease Outcome: Progressing   Problem: Role Relationship: Goal: Ability to demonstrate positive interaction with newborn will improve Outcome: Progressing   Problem: Skin Integrity: Goal: Demonstration of wound healing without infection will improve Outcome: Progressing

## 2024-04-02 NOTE — Lactation Note (Signed)
 This note was copied from a baby's chart.  NICU Lactation Consultation Note  Patient Name: Sarah Luna Unijb'd Date: 04/02/2024 Age:24 hours  Reason for consult: Initial assessment; Term; NICU baby  SUBJECTIVE OBSC RN Luke C reported that Ms. Shiflett is still planning on doing formula only after NICU admission. RN voiced she'll review lactation suppression with patient. Lactation services are completed at this time.  OBJECTIVE Infant data: Mother's Current Feeding Choice: Formula  O2 Device: Ventilator FiO2 (%): 50 %  Maternal data: [id number] Vaginal, Spontaneous Current breast feeding challenges:: NICU admission Risk factor for low/delayed milk supply:: infant separation  ASSESSMENT Infant: Feeding Status: NPO  Maternal: Not pumping  INTERVENTIONS/PLAN Interventions: No data recorded Plan: Consult Status: Complete   Ossiel Marchio S Cloys Vera 04/02/2024, 3:46 PM

## 2024-04-03 ENCOUNTER — Encounter (HOSPITAL_COMMUNITY): Payer: Self-pay | Admitting: Obstetrics and Gynecology

## 2024-04-03 DIAGNOSIS — O99019 Anemia complicating pregnancy, unspecified trimester: Secondary | ICD-10-CM | POA: Insufficient documentation

## 2024-04-03 HISTORY — DX: Anemia complicating pregnancy, unspecified trimester: O99.019

## 2024-04-03 LAB — CBC
HCT: 22.6 % — ABNORMAL LOW (ref 36.0–46.0)
Hemoglobin: 7 g/dL — ABNORMAL LOW (ref 12.0–15.0)
MCH: 23.3 pg — ABNORMAL LOW (ref 26.0–34.0)
MCHC: 31 g/dL (ref 30.0–36.0)
MCV: 75.1 fL — ABNORMAL LOW (ref 80.0–100.0)
Platelets: 171 K/uL (ref 150–400)
RBC: 3.01 MIL/uL — ABNORMAL LOW (ref 3.87–5.11)
RDW: 16.9 % — ABNORMAL HIGH (ref 11.5–15.5)
WBC: 15 K/uL — ABNORMAL HIGH (ref 4.0–10.5)
nRBC: 0 % (ref 0.0–0.2)

## 2024-04-03 MED ORDER — POTASSIUM CHLORIDE CRYS ER 20 MEQ PO TBCR
20.0000 meq | EXTENDED_RELEASE_TABLET | Freq: Every day | ORAL | Status: DC
Start: 2024-04-03 — End: 2024-04-08
  Administered 2024-04-03 – 2024-04-04 (×2): 20 meq via ORAL
  Filled 2024-04-03 (×2): qty 1

## 2024-04-03 MED ORDER — FUROSEMIDE 20 MG PO TABS
20.0000 mg | ORAL_TABLET | Freq: Every day | ORAL | Status: DC
  Administered 2024-04-03 – 2024-04-04 (×2): 20 mg via ORAL
  Filled 2024-04-03 (×2): qty 1

## 2024-04-03 MED ORDER — SODIUM CHLORIDE 0.9 % IV SOLN: 300.0000 mg | Freq: Once | INTRAVENOUS | Status: DC

## 2024-04-03 MED ORDER — SODIUM CHLORIDE 0.9 % IV SOLN
300.0000 mg | Freq: Once | INTRAVENOUS | Status: AC
  Administered 2024-04-03: 300 mg via INTRAVENOUS
  Filled 2024-04-03: qty 15

## 2024-04-03 NOTE — Anesthesia Postprocedure Evaluation (Signed)
 Anesthesia Post Note  Patient: Sarah Luna  Procedure(s) Performed: AN AD HOC LABOR EPIDURAL     Patient location during evaluation: Mother Baby Anesthesia Type: Epidural Level of consciousness: awake, awake and alert and oriented Pain management: satisfactory to patient Vital Signs Assessment: post-procedure vital signs reviewed and stable Respiratory status: spontaneous breathing, nonlabored ventilation and respiratory function stable Cardiovascular status: stable and blood pressure returned to baseline Postop Assessment: no headache, no backache, no apparent nausea or vomiting, able to ambulate, adequate PO intake and patient able to bend at knees Anesthetic complications: no   No notable events documented.  Last Vitals:  Vitals:   04/03/24 1000 04/03/24 1541  BP: 129/82 129/88  Pulse: 72 62  Resp: 18 17  Temp: 36.5 C 36.5 C  SpO2: 99% 100%    Last Pain:  Vitals:   04/03/24 1541  TempSrc: Oral  PainSc:    Pain Goal:                   Crit Obremski

## 2024-04-03 NOTE — Progress Notes (Signed)
 Post Partum Day 1 Subjective: No complaints, up ad lib, voiding, and tolerating PO.  Baby is stable in NICU.  Bottle feeding.   Objective: Blood pressure 127/79, pulse 68, temperature 97.8 F (36.6 C), temperature source Oral, resp. rate 16, height 5' 1 (1.549 m), weight 83 kg, last menstrual period 06/27/2023, SpO2 98%, unknown if currently breastfeeding.  Physical Exam:  General: alert and no distress Lochia: appropriate Uterine Fundus: firm DVT Evaluation: No evidence of DVT seen on physical exam. Negative Homan's sign. No cords or calf tenderness. No significant calf/ankle edema.  Recent Labs    04/01/24 1245 04/03/24 0534  HGB 9.0* 7.0*  HCT 29.3* 22.6*    Assessment/Plan: BP stable, will continue to monitor. On Lasix and K for now. Patient clinically significant but asymptomatic acute postpartum anemia superimposed on chronic anemia due to expected blood loss. She was offered a blood transfusion, she declined this. Venofer was then offered, she agreed to this.  Will administer this today. Pain medications as needed. Bottlefeeding, POPs for contraception. Plan for discharge tomorrow if remains stable.   LOS: 2 days   Gloris Hugger, MD 04/03/2024, 7:15 AM

## 2024-04-03 NOTE — Plan of Care (Signed)
  Problem: Education: Goal: Knowledge of General Education information will improve Description: Including pain rating scale, medication(s)/side effects and non-pharmacologic comfort measures Outcome: Progressing   Problem: Health Behavior/Discharge Planning: Goal: Ability to manage health-related needs will improve Outcome: Progressing   Problem: Clinical Measurements: Goal: Ability to maintain clinical measurements within normal limits will improve Outcome: Progressing Goal: Will remain free from infection Outcome: Progressing   Problem: Coping: Goal: Level of anxiety will decrease Outcome: Progressing   Problem: Pain Managment: Goal: General experience of comfort will improve and/or be controlled Outcome: Progressing   Problem: Safety: Goal: Ability to remain free from injury will improve Outcome: Progressing

## 2024-04-04 ENCOUNTER — Other Ambulatory Visit (HOSPITAL_COMMUNITY): Payer: Self-pay

## 2024-04-04 MED ORDER — POTASSIUM CHLORIDE CRYS ER 20 MEQ PO TBCR
20.0000 meq | EXTENDED_RELEASE_TABLET | Freq: Every day | ORAL | 0 refills | Status: AC
  Filled 2024-04-04: qty 4, 4d supply, fill #0

## 2024-04-04 MED ORDER — POLYSACCHARIDE IRON COMPLEX 150 MG PO CAPS
150.0000 mg | ORAL_CAPSULE | Freq: Every day | ORAL | Status: DC
  Administered 2024-04-04: 150 mg via ORAL
  Filled 2024-04-04: qty 1

## 2024-04-04 MED ORDER — FERRIC MALTOL 30 MG PO CAPS: 1.0000 | ORAL_CAPSULE | Freq: Every day | ORAL | Status: DC

## 2024-04-04 MED ORDER — FUROSEMIDE 20 MG PO TABS
20.0000 mg | ORAL_TABLET | Freq: Every day | ORAL | 0 refills | Status: AC
  Filled 2024-04-04: qty 4, 4d supply, fill #0

## 2024-04-04 MED ORDER — IBUPROFEN 600 MG PO TABS
600.0000 mg | ORAL_TABLET | Freq: Four times a day (QID) | ORAL | 0 refills | Status: AC
  Filled 2024-04-04: qty 30, 8d supply, fill #0
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
## 2024-04-05 LAB — SURGICAL PATHOLOGY

## 2024-04-09 ENCOUNTER — Inpatient Hospital Stay (HOSPITAL_COMMUNITY): Admission: RE | Admit: 2024-04-09 | Source: Home / Self Care | Admitting: Obstetrics & Gynecology

## 2024-04-09 ENCOUNTER — Inpatient Hospital Stay (HOSPITAL_COMMUNITY)

## 2024-04-11 ENCOUNTER — Ambulatory Visit

## 2024-04-11 ENCOUNTER — Telehealth (HOSPITAL_COMMUNITY): Payer: Self-pay | Admitting: *Deleted

## 2024-04-11 NOTE — Telephone Encounter (Signed)
 04/11/2024  Name: Sarah Luna MRN: [id number] DOB: 11-Jul-2000  Reason for Call:  Transition of Care Hospital Discharge Call  Contact Status: Patient Contact Status: Complete  Language assistant needed:          Follow-Up Questions: Do You Have Any Concerns About Your Health As You Heal From Delivery?: No Do You Have Any Concerns About Your Infants Health?: Infant in NICU  Edinburgh Postnatal Depression Scale:  In the Past 7 Days: I have been able to laugh and see the funny side of things.: As much as I always could I have looked forward with enjoyment to things.: As much as I ever did I have blamed myself unnecessarily when things went wrong.: No, never I have been anxious or worried for no good reason.: No, not at all I have felt scared or panicky for no good reason.: No, not at all Things have been getting on top of me.: No, I have been coping as well as ever I have been so unhappy that I have had difficulty sleeping.: Not at all I have felt sad or miserable.: No, not at all I have been so unhappy that I have been crying.: No, never The thought of harming myself has occurred to me.: Never [place] Postnatal Depression Scale Total: 0  PHQ2-9 Depression Scale:     Discharge Follow-up: Edinburgh score requires follow up?: No Patient was advised of the following resources:: Breastfeeding Support Group, Support Group Requested email information - sent by RN. Post-discharge interventions: Reviewed Newborn Safe Sleep Practices  Signature Allean IVAR Carton, RN, 04/11/24, [phone]

## 2024-05-04 ENCOUNTER — Ambulatory Visit: Admitting: Physician Assistant

## 2024-05-09 ENCOUNTER — Other Ambulatory Visit (HOSPITAL_COMMUNITY)
Admission: RE | Admit: 2024-05-09 | Discharge: 2024-05-09 | Disposition: A | Discharge status: Home / Self Care | Source: Ambulatory Visit | Attending: Student | Admitting: Student

## 2024-05-09 ENCOUNTER — Encounter: Payer: Self-pay | Admitting: Student

## 2024-05-09 ENCOUNTER — Ambulatory Visit (INDEPENDENT_AMBULATORY_CARE_PROVIDER_SITE_OTHER): Admitting: Student

## 2024-05-09 DIAGNOSIS — Z30011 Encounter for initial prescription of contraceptive pills: Secondary | ICD-10-CM

## 2024-05-09 DIAGNOSIS — A749 Chlamydial infection, unspecified: Secondary | ICD-10-CM

## 2024-05-09 MED ORDER — NORETHIN ACE-ETH ESTRAD-FE 1-20 MG-MCG PO TABS: 1.0000 | ORAL_TABLET | Freq: Every day | ORAL | 11 refills | Status: AC

## 2024-05-09 NOTE — Progress Notes (Unsigned)
 Post Partum Visit Note  MERCER STALLWORTH is a 23 y.o. G61P1102 female who presents for a postpartum visit. She is 5 weeks postpartum following a normal spontaneous vaginal delivery.  I have fully reviewed the prenatal and intrapartum course. The delivery was at [redacted]w[redacted]d gestational weeks.  Anesthesia: epidural. Postpartum course has been good. Baby is doing well. Baby is feeding by bottle - Similac Advance. Bleeding no bleeding. Bowel function is normal. Bladder function is normal. Patient is not sexually active. Contraception method is OCP (estrogen/progesterone) and oral progesterone-only contraceptive. Postpartum depression screening: negative.   The pregnancy intention screening data noted above was reviewed. Potential methods of contraception were discussed. The patient elected to proceed with No data recorded.   Edinburgh Postnatal Depression Scale - 05/09/24 0933       Edinburgh Postnatal Depression Scale:  In the Past 7 Days   I have been able to laugh and see the funny side of things. 0    I have looked forward with enjoyment to things. 0    I have blamed myself unnecessarily when things went wrong. 0    I have been anxious or worried for no good reason. 0    I have felt scared or panicky for no good reason. 0    Things have been getting on top of me. 0    I have been so unhappy that I have had difficulty sleeping. 0    I have felt sad or miserable. 0    I have been so unhappy that I have been crying. 0    The thought of harming myself has occurred to me. 0    Edinburgh Postnatal Depression Scale Total 0          Health Maintenance Due  Topic Date Due   Influenza Vaccine  Never done   COVID-19 Vaccine (1 - 2025-26 season) Never done    The following portions of the patient's history were reviewed and updated as appropriate: allergies, current medications, past family history, past medical history, past social history, past surgical history, and problem list.  Review of  Systems Pertinent items are noted in HPI.  Objective:  BP 113/72   Pulse 61   Ht 5' (1.524 m)   Wt 158 lb 6.4 oz (71.8 kg)   LMP 06/27/2023 (Exact Date)   Breastfeeding No   BMI 30.94 kg/m    General:  alert and cooperative   Breasts:  not indicated  Lungs: Normal work of breathing  Heart:  regular rate and rhythm  Abdomen: soft, non-tender; bowel sounds normal; no masses,  no organomegaly   Wound Not indicated  GU exam:  normal       Assessment:    There are no diagnoses linked to this encounter.  Normal postpartum exam.   Plan:   Essential components of care per ACOG recommendations:  1.  Mood and well being: Patient with negative depression screening today. Reviewed local resources for support.  - Patient tobacco use? No.   - hx of drug use? No.    2. Infant care and feeding:  -Patient currently breastmilk feeding? No.  -Social determinants of health (SDOH) reviewed in EPIC. No concerns  3. Sexuality, contraception and birth spacing - Patient does not want a pregnancy in the next year.  Desired family size is 2 children.  - Reviewed reproductive life planning. Reviewed contraceptive methods based on pt preferences and effectiveness.  Patient desired Oral Contraceptive today.   - Discussed birth  spacing of 18 months  4. Sleep and fatigue -Encouraged family/partner/community support of 4 hrs of uninterrupted sleep to help with mood and fatigue  5. Physical Recovery  - Discussed patients delivery and complications. She describes her labor as good. - Patient had a Vaginal, no problems at delivery. Patient did not have a laceration. Perineal healing reviewed. Patient expressed understanding - Patient has urinary incontinence? No. - Patient is safe to resume physical and sexual activity in 1 week  6.  Health Maintenance - HM due items addressed Yes - Last pap smear  Diagnosis  Date Value Ref Range Status  09/22/2023   Final   - Negative for intraepithelial  lesion or malignancy (NILM)   Pap smear not done at today's visit.  -Breast Cancer screening indicated? No.   7. Chronic Disease/Pregnancy Condition follow up: Anemia  - PCP follow up  Nat Dauer, NP [facility], [facility]

## 2024-05-10 LAB — CERVICOVAGINAL ANCILLARY ONLY
Bacterial Vaginitis (gardnerella): POSITIVE — AB
Candida Glabrata: NEGATIVE
Candida Vaginitis: NEGATIVE
Chlamydia: POSITIVE — AB
Comment: NEGATIVE
Comment: NEGATIVE
Comment: NEGATIVE
Comment: NEGATIVE
Comment: NEGATIVE
Comment: NORMAL
Neisseria Gonorrhea: NEGATIVE
Trichomonas: NEGATIVE

## 2024-05-11 ENCOUNTER — Other Ambulatory Visit: Payer: Self-pay

## 2024-05-11 MED ORDER — DOXYCYCLINE HYCLATE 100 MG PO CAPS: 100.0000 mg | ORAL_CAPSULE | Freq: Two times a day (BID) | ORAL | 0 refills | Status: AC

## 2024-05-11 NOTE — Progress Notes (Signed)
 Doxycyline rx sent per protocol for + chlamydia

## 2024-05-15 ENCOUNTER — Ambulatory Visit: Payer: Self-pay | Admitting: Student
